# Patient Record
Sex: Female | Born: 1955 | Race: White | Hispanic: No | Marital: Married
Health system: Southern US, Community
[De-identification: ages and names within clinical notes are randomized; demographics above are authoritative.]

## PROBLEM LIST (undated history)

## (undated) DIAGNOSIS — I5032 Chronic diastolic (congestive) heart failure: Secondary | ICD-10-CM

## (undated) DIAGNOSIS — J9621 Acute and chronic respiratory failure with hypoxia: Secondary | ICD-10-CM

## (undated) DIAGNOSIS — J189 Pneumonia, unspecified organism: Secondary | ICD-10-CM

## (undated) DIAGNOSIS — K565 Intestinal adhesions [bands], unspecified as to partial versus complete obstruction: Secondary | ICD-10-CM

## (undated) DIAGNOSIS — Z930 Tracheostomy status: Secondary | ICD-10-CM

---

## 2019-03-04 ENCOUNTER — Inpatient Hospital Stay
Admission: RE | Admit: 2019-03-04 | Discharge: 2019-04-08 | Disposition: A | Discharge status: Skilled Nursing Facility | Payer: Managed Care, Other (non HMO) | Source: Ambulatory Visit | Attending: Internal Medicine | Admitting: Internal Medicine

## 2019-03-04 DIAGNOSIS — J9690 Respiratory failure, unspecified, unspecified whether with hypoxia or hypercapnia: Secondary | ICD-10-CM

## 2019-03-04 DIAGNOSIS — I5032 Chronic diastolic (congestive) heart failure: Secondary | ICD-10-CM | POA: Diagnosis present

## 2019-03-04 DIAGNOSIS — Z931 Gastrostomy status: Secondary | ICD-10-CM

## 2019-03-04 DIAGNOSIS — Z930 Tracheostomy status: Secondary | ICD-10-CM

## 2019-03-04 DIAGNOSIS — J9621 Acute and chronic respiratory failure with hypoxia: Secondary | ICD-10-CM | POA: Diagnosis present

## 2019-03-04 DIAGNOSIS — K565 Intestinal adhesions [bands], unspecified as to partial versus complete obstruction: Secondary | ICD-10-CM | POA: Diagnosis present

## 2019-03-04 DIAGNOSIS — J189 Pneumonia, unspecified organism: Secondary | ICD-10-CM | POA: Diagnosis present

## 2019-03-04 HISTORY — DX: Pneumonia, unspecified organism: J18.9

## 2019-03-04 HISTORY — DX: Intestinal adhesions (bands), unspecified as to partial versus complete obstruction: K56.50

## 2019-03-04 HISTORY — DX: Chronic diastolic (congestive) heart failure: I50.32

## 2019-03-04 HISTORY — DX: Acute and chronic respiratory failure with hypoxia: J96.21

## 2019-03-04 HISTORY — DX: Tracheostomy status: Z93.0

## 2019-03-05 ENCOUNTER — Encounter: Payer: Self-pay | Admitting: Internal Medicine

## 2019-03-05 ENCOUNTER — Other Ambulatory Visit (HOSPITAL_COMMUNITY): Payer: Self-pay

## 2019-03-05 DIAGNOSIS — J189 Pneumonia, unspecified organism: Secondary | ICD-10-CM | POA: Diagnosis not present

## 2019-03-05 DIAGNOSIS — Z930 Tracheostomy status: Secondary | ICD-10-CM

## 2019-03-05 DIAGNOSIS — J9621 Acute and chronic respiratory failure with hypoxia: Secondary | ICD-10-CM

## 2019-03-05 DIAGNOSIS — K565 Intestinal adhesions [bands], unspecified as to partial versus complete obstruction: Secondary | ICD-10-CM | POA: Diagnosis present

## 2019-03-05 DIAGNOSIS — I5032 Chronic diastolic (congestive) heart failure: Secondary | ICD-10-CM | POA: Diagnosis present

## 2019-03-05 LAB — CBC
HCT: 26.3 % — ABNORMAL LOW (ref 36.0–46.0)
Hemoglobin: 8.1 g/dL — ABNORMAL LOW (ref 12.0–15.0)
MCH: 28 pg (ref 26.0–34.0)
MCHC: 30.8 g/dL (ref 30.0–36.0)
MCV: 91 fL (ref 80.0–100.0)
Platelets: 381 10*3/uL (ref 150–400)
RBC: 2.89 MIL/uL — ABNORMAL LOW (ref 3.87–5.11)
RDW: 17 % — ABNORMAL HIGH (ref 11.5–15.5)
WBC: 15.8 10*3/uL — ABNORMAL HIGH (ref 4.0–10.5)
nRBC: 0 % (ref 0.0–0.2)

## 2019-03-05 LAB — BLOOD GAS, ARTERIAL
Acid-Base Excess: 7.4 mmol/L — ABNORMAL HIGH (ref 0.0–2.0)
Bicarbonate: 31 mmol/L — ABNORMAL HIGH (ref 20.0–28.0)
FIO2: 30
O2 Saturation: 97.6 %
Patient temperature: 37
pCO2 arterial: 40.2 mmHg (ref 32.0–48.0)
pH, Arterial: 7.498 — ABNORMAL HIGH (ref 7.350–7.450)
pO2, Arterial: 89 mmHg (ref 83.0–108.0)

## 2019-03-05 LAB — COMPREHENSIVE METABOLIC PANEL
ALT: 63 U/L — ABNORMAL HIGH (ref 0–44)
AST: 49 U/L — ABNORMAL HIGH (ref 15–41)
Albumin: 1.7 g/dL — ABNORMAL LOW (ref 3.5–5.0)
Alkaline Phosphatase: 200 U/L — ABNORMAL HIGH (ref 38–126)
Anion gap: 11 (ref 5–15)
BUN: 18 mg/dL (ref 8–23)
CO2: 27 mmol/L (ref 22–32)
Calcium: 7.9 mg/dL — ABNORMAL LOW (ref 8.9–10.3)
Chloride: 104 mmol/L (ref 98–111)
Creatinine, Ser: 0.57 mg/dL (ref 0.44–1.00)
GFR calc Af Amer: >60 mL/min (ref >60)
GFR calc non Af Amer: >60 mL/min (ref >60)
Glucose, Bld: 101 mg/dL — ABNORMAL HIGH (ref 70–99)
Potassium: 3.6 mmol/L (ref 3.5–5.1)
Sodium: 142 mmol/L (ref 135–145)
Total Bilirubin: 0.8 mg/dL (ref 0.3–1.2)
Total Protein: 5.5 g/dL — ABNORMAL LOW (ref 6.5–8.1)

## 2019-03-05 MED ORDER — SERTRALINE HCL 50 MG PO TABS
100.00 | ORAL_TABLET | ORAL | Status: DC
Start: 2019-03-05 — End: 2019-03-05

## 2019-03-05 MED ORDER — ALBUTEROL SULFATE (2.5 MG/3ML) 0.083% IN NEBU
2.50 | INHALATION_SOLUTION | RESPIRATORY_TRACT | Status: DC
Start: 2019-03-05 — End: 2019-03-05

## 2019-03-05 MED ORDER — ENOXAPARIN SODIUM 40 MG/0.4ML ~~LOC~~ SOLN
40.00 | SUBCUTANEOUS | Status: DC
Start: 2019-03-05 — End: 2019-03-05

## 2019-03-05 MED ORDER — SODIUM CHLORIDE 3 % IN NEBU
3.00 | INHALATION_SOLUTION | RESPIRATORY_TRACT | Status: DC
Start: 2019-03-05 — End: 2019-03-05

## 2019-03-05 MED ORDER — TRAMADOL HCL 50 MG PO TABS
50.00 | ORAL_TABLET | ORAL | Status: DC
Start: ? — End: 2019-03-05

## 2019-03-05 MED ORDER — ACETAMINOPHEN 160 MG/5ML PO SOLN
650.00 | ORAL | Status: DC
Start: 2019-03-05 — End: 2019-03-05

## 2019-03-05 MED ORDER — DSS 100 MG PO CAPS
100.00 | ORAL_CAPSULE | ORAL | Status: DC
Start: 2019-03-05 — End: 2019-03-05

## 2019-03-05 MED ORDER — CHLORHEXIDINE GLUCONATE 0.12 % MT SOLN
15.00 | OROMUCOSAL | Status: DC
Start: ? — End: 2019-03-05

## 2019-03-05 MED ORDER — GENERIC EXTERNAL MEDICATION
12.50 | Status: DC
Start: 2019-03-05 — End: 2019-03-05

## 2019-03-05 MED ORDER — GENERIC EXTERNAL MEDICATION
40.00 | Status: DC
Start: 2019-03-05 — End: 2019-03-05

## 2019-03-05 MED ORDER — BUPROPION HCL 75 MG PO TABS
150.00 | ORAL_TABLET | ORAL | Status: DC
Start: 2019-03-05 — End: 2019-03-05

## 2019-03-05 MED ORDER — FLINTSTONES/EXTRA C PO CHEW
1.00 | CHEWABLE_TABLET | ORAL | Status: DC
Start: 2019-03-05 — End: 2019-03-05

## 2019-03-05 MED ORDER — IOHEXOL 300 MG/ML  SOLN
50.0000 mL | Freq: Once | INTRAMUSCULAR | Status: AC | PRN
  Administered 2019-03-05: 50 mL

## 2019-03-05 MED ORDER — DEXTROSE 10 % IV SOLN
125.00 | INTRAVENOUS | Status: DC
Start: ? — End: 2019-03-05

## 2019-03-05 MED ORDER — ATORVASTATIN CALCIUM 40 MG PO TABS
80.00 | ORAL_TABLET | ORAL | Status: DC
Start: 2019-03-05 — End: 2019-03-05

## 2019-03-05 MED ORDER — GENERIC EXTERNAL MEDICATION
Status: DC
Start: ? — End: 2019-03-05

## 2019-03-05 MED ORDER — POLYETHYLENE GLYCOL 3350 17 GM/SCOOP PO POWD
17.00 | ORAL | Status: DC
Start: 2019-03-05 — End: 2019-03-05

## 2019-03-05 MED ORDER — HYDROMORPHONE HCL 1 MG/ML PO LIQD
1.00 | ORAL | Status: DC
Start: ? — End: 2019-03-05

## 2019-03-05 MED ORDER — CHLORHEXIDINE GLUCONATE 0.12 % MT SOLN
15.00 | OROMUCOSAL | Status: DC
Start: 2019-03-05 — End: 2019-03-05

## 2019-03-05 MED ORDER — ASPIRIN 81 MG PO CHEW
81.00 | CHEWABLE_TABLET | ORAL | Status: DC
Start: 2019-03-05 — End: 2019-03-05

## 2019-03-05 MED ORDER — ASENAPINE MALEATE 10 MG SL SUBL
10.00 | SUBLINGUAL_TABLET | SUBLINGUAL | Status: DC
Start: 2019-03-05 — End: 2019-03-05

## 2019-03-05 MED ORDER — ONDANSETRON HCL 4 MG/2ML IJ SOLN
4.00 | INTRAMUSCULAR | Status: DC
Start: ? — End: 2019-03-05

## 2019-03-05 MED ORDER — GENERIC EXTERNAL MEDICATION
6.25 | Status: DC
Start: ? — End: 2019-03-05

## 2019-03-05 NOTE — Consult Note (Signed)
Pulmonary [place]  Date of Service: 03/05/2019  PULMONARY CRITICAL CARE CONSULT   Kristina Holmes  HER:[id number]  DOB: Aug 24, 1955   DOA: 03/04/2019  Referring Physician: Merton Border, MD  HPI: Kristina Holmes is a 64 y.o. female seen for follow up of Acute on Chronic Respiratory Failure.  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  She had undergone a hysterectomy in January of this year an abdominal CT was done on January 31 which demonstrated a small bowel obstruction.  Patient was having some nausea and vomiting after having been discharged from the acute care facility.  When she came into the ER she was hypotensive with a blood pressure of 85/40 and she was on 4 L of O2.  She was in significant respiratory distress at the time also.  After she had the surgery she had multiple complications including failure to come off of the ventilator development of healthcare associated pneumonia she underwent a tracheostomy on February 19, 2019 subsequently was not able to wean.  The pneumonia she had was secondary to [place] patient was treated with antibiotics.  A follow-up chest x-ray which had been done on the 23rd was reportedly showing improvement of atelectasis.  She has had issues with retention of mucus.  She currently is on assist control mode appears somewhat anxious but otherwise is comfortable.  Review of Systems:  ROS performed and is unremarkable other than noted above.  Past Medical History:  Diagnosis Date  . Abnormal Pap smear of cervix  . Allergy, unspecified not elsewhere classified  . Arthritis  . Astigmatism, irregular  . Calcaneal spur  . Calcaneal  spur  . Cervical high risk HPV (human papillomavirus) test positive 2016, 2017  . Complete gastric outlet obstruction  . Degenerative joint disease involving multiple joints  . Depression  . Deviated nasal septum  . Diabetes mellitus  . GERD (gastroesophageal reflux disease)  . Gestational diabetes  . Granuloma annulare  . Hemorrhoids  . History of depression  . Hyperlipidemia  . Hypertension  . Iron deficiency anemia, unspecified  . Irritable bowel syndrome  . Macular cyst, hole, or pseudohole of retina  . Memory loss  . Morbid obesity ([place])  . Osteopenia after menopause 04/2017  multiple sites  . Other disorder of menstruation and other abnormal bleeding from female genital tract  . Personal history of urinary calculi  . Posterior subcapsular polar senile cataract  . Pseudophakia of both eyes 12/27/2010  . Retinal tear - Both Eyes 12/27/2010  s/p Laser  . Sleep apnea  . Stroke [facility])   Baseline Functional Status with ADLs (bathing, feeding, dressing, toileting and mobility): Independent  Past Surgical History:  Procedure Laterality Date  . ABDOMINAL ADHESION SURGERY  . CARDIAC ELECTROPHYSIOLOGY MAPPING AND ABLATION  . CATARACT EXTRACTION Bilateral  . CESAREAN SECTION  X3  . COLONOSCOPY  . COLPOSCOPY 04/2015  [facility]  . COLPOSCOPY 05/20/2016  [facility]  . COLPOSCOPY 10/21/2018  Impression: NL with atrophy  . KIDNEY STONE SURGERY  . Resection Calcaneal Spur  Right  . ROUX-EN-Y PROCEDURE 2007  . SALPINGOOPHORECTOMY Bilateral 02/03/2019  Procedure: SALPINGO-OOPHORECTOMY OPEN; Surgeon: Lenon Ahmadi, MD; Location: [place]; Service: Gynecology; Laterality: Bilateral;  . SEPTOPLASTY  . SVT Ablation  2000  . TONSILLECTOMY AND ADENOIDECTOMY  . TOTAL ABDOMINAL HYSTERECTOMY N/A 02/03/2019  Procedure: HYSTERECTOMY TOTAL ABDOMINAL; Surgeon: Lenon Ahmadi, MD; Location: [place]; Service: Gynecology; Laterality: N/A;  . TRUNCAL VAGOTOMY  . TUBAL  LIGATION   Family History  Problem Relation Age of Onset  . Cataracts Sister  . Cancer Sister  ovarian  . Ovarian cancer Sister  . Cataracts Sister  . Kidney disease Sister  . Diabetes Mother  . Heart disease Mother  . Hypertension Mother  . Stroke Mother  . Osteoporosis Mother  . Dementia Mother  . Heart disease Father  . Hypertension Father  . Stroke Father  . Emphysema Father  . Nephrolithiasis Father  . Diabetes Maternal Uncle  . Anxiety disorder Daughter  . Anxiety disorder Son  . Cataracts Sister  . Breast cancer Neg Hx  . Thyroid disease Neg Hx  . Colon cancer Neg Hx  . Deep vein thrombosis Neg Hx  . BRCA 1/2 Neg Hx  . Endometrial cancer Neg Hx  . Parkinsonism Neg Hx  . Seizures Neg Hx  . Headache Neg Hx  . Migraines Neg Hx  . Multiple sclerosis Neg Hx     Medications: Reviewed on Rounds  Physical Exam:  Vitals: Temperature is 97.6 pulse 87 respiratory 31 blood pressure is 164/82 saturations 98%  Ventilator Settings mode ventilation assist control FiO2 is 30% tidal volume 389 PEEP 5  . General: Comfortable at this time . Eyes: Grossly normal lids, irises & conjunctiva . ENT: grossly tongue is normal . Neck: no obvious mass . Cardiovascular: S1-S2 normal no gallop or rub . Respiratory: Coarse breath sounds with few scattered rhonchi . Abdomen: Soft and nontender . Skin: no rash seen on limited exam . Musculoskeletal: not rigid . Psychiatric:unable to assess . Neurologic: no seizure no involuntary movements         Labs on Admission:  Basic Metabolic Panel: No results for input(s): NA, K, CL, CO2, GLUCOSE, BUN, CREATININE, CALCIUM, MG, PHOS in the last 168 hours.  Recent Labs  Lab 03/05/19 0415  PHART 7.498*  PCO2ART 40.2  PO2ART 89.0  HCO3 31.0*  O2SAT 97.6    Liver Function Tests: No results for input(s): AST, ALT, ALKPHOS, BILITOT, PROT, ALBUMIN in the last 168 hours. No results for input(s): LIPASE, AMYLASE in the last 168  hours. No results for input(s): AMMONIA in the last 168 hours.  CBC: No results for input(s): WBC, NEUTROABS, HGB, HCT, MCV, PLT in the last 168 hours.  Cardiac Enzymes: No results for input(s): CKTOTAL, CKMB, CKMBINDEX, TROPONINI in the last 168 hours.  BNP (last 3 results) No results for input(s): BNP in the last 8760 hours.  ProBNP (last 3 results) No results for input(s): PROBNP in the last 8760 hours.   Radiological Exams on Admission: DG ABDOMEN PEG TUBE LOCATION  Result Date: 03/05/2019 CLINICAL DATA:  Chest of peg tube placement. EXAM: ABDOMEN - 1 VIEW COMPARISON:  Radiographs dated 02/09/2019 FINDINGS: Contrast was injected into the PEG tube. The tube is demonstrated to be in the body of the stomach. No extravasation of contrast. Moderate gas throughout the nondistended colon. Slight dilatation of a single small bowel loop in the left lower quadrant, diminished since the prior study. IMPRESSION: PEG tube appears to be in the body of the stomach. No extravasation of contrast. Electronically Signed   By: Lorriane Shire M.D.   On: 03/05/2019 08:34    Assessment/Plan Active Problems:   Acute on chronic respiratory failure  with hypoxia ([place])   Healthcare-associated pneumonia   Tracheostomy status ([place])   Chronic diastolic heart failure (HCC)   Small bowel obstruction due to adhesions ([place])   1. Acute on chronic respiratory failure with hypoxia she is obviously had a multiple complicated course with development of respiratory failure post operatively.  She has a history of multiple abdominal surgeries.  The patient had an extensive work-up done including an echocardiogram which showed a preserved ejection fraction however she was felt to be in diastolic heart failure.  At this time we are going to check the RSB I mechanics and try to advance the weaning as tolerated. 2. Healthcare associated pneumonia treated at the other facility completed antibiotic she apparently received  cefepime for 7 days total.  Would recommend getting a follow-up chest x-ray to assess complete resolution. 3. Tracheostomy status ultimately the goal will be to wean and decannulate we will continue with supportive care try pressure support weaning today. 4. Chronic diastolic heart failure preserved ejection fraction last EF was 60 to 65% on the echocardiogram we will continue with supportive care 5. Small bowel obstruction status post exploratory laparotomy with adhesion lysis we will continue with supportive care no sign of obstruction right now  I have personally seen and evaluated the patient, evaluated laboratory and imaging results, formulated the assessment and plan and placed orders. The Patient requires high complexity decision making with multiple systems involvement.  Case was discussed on Rounds with the Respiratory Therapy Director and the Respiratory staff Time Spent 23mnutes  Josip Merolla A Ronesha Heenan, MD FPhysicians Surgery Center Of LebanonPulmonary Critical Care Medicine Sleep Medicine

## 2019-03-06 DIAGNOSIS — J189 Pneumonia, unspecified organism: Secondary | ICD-10-CM | POA: Diagnosis not present

## 2019-03-06 DIAGNOSIS — J9621 Acute and chronic respiratory failure with hypoxia: Secondary | ICD-10-CM | POA: Diagnosis not present

## 2019-03-06 DIAGNOSIS — I5032 Chronic diastolic (congestive) heart failure: Secondary | ICD-10-CM | POA: Diagnosis not present

## 2019-03-06 DIAGNOSIS — K565 Intestinal adhesions [bands], unspecified as to partial versus complete obstruction: Secondary | ICD-10-CM | POA: Diagnosis not present

## 2019-03-06 NOTE — Progress Notes (Addendum)
Pulmonary Critical Care Medicine [facility] [place]   PULMONARY CRITICAL CARE SERVICE  PROGRESS NOTE  Date of Service: 03/06/2019  Kristina Holmes  HWE:[id number]  DOB: April 05, 1955   DOA: 03/04/2019  Referring Physician: Carron Curie, MD  HPI: Kristina Holmes is a 64 y.o. female seen for follow up of Acute on Chronic Respiratory Failure.  Patient continues on pressure support 12/5 FiO2 30% per 8-hour goal today satting well no distress.  Medications: Reviewed on Rounds  Physical Exam:  Vitals: Pulse 75 respirations 24 BP 103/58 O2 sat 98% temp 98.5  Ventilator Settings pressure support 12/5 FiO2 30%  . General: Comfortable at this time . Eyes: Grossly normal lids, irises & conjunctiva . ENT: grossly tongue is normal . Neck: no obvious mass . Cardiovascular: S1 S2 normal no gallop . Respiratory: No rales or rhonchi noted . Abdomen: soft . Skin: no rash seen on limited exam . Musculoskeletal: not rigid . Psychiatric:unable to assess . Neurologic: no seizure no involuntary movements         Lab Data:   Basic Metabolic Panel: Recent Labs  Lab 03/05/19 1104  NA 142  K 3.6  CL 104  CO2 27  GLUCOSE 101*  BUN 18  CREATININE 0.57  CALCIUM 7.9*    ABG: Recent Labs  Lab 03/05/19 0415  PHART 7.498*  PCO2ART 40.2  PO2ART 89.0  HCO3 31.0*  O2SAT 97.6    Liver Function Tests: Recent Labs  Lab 03/05/19 1104  AST 49*  ALT 63*  ALKPHOS 200*  BILITOT 0.8  PROT 5.5*  ALBUMIN 1.7*   No results for input(s): LIPASE, AMYLASE in the last 168 hours. No results for input(s): AMMONIA in the last 168 hours.  CBC: Recent Labs  Lab 03/05/19 1104  WBC 15.8*  HGB 8.1*  HCT 26.3*  MCV 91.0  PLT 381    Cardiac Enzymes: No results for input(s): CKTOTAL, CKMB, CKMBINDEX, TROPONINI in the last 168 hours.  BNP (last 3 results) No results for input(s): BNP in the last 8760 hours.  ProBNP (last 3 results) No results for input(s): PROBNP in the last 8760  hours.  Radiological Exams: DG ABDOMEN PEG TUBE LOCATION  Result Date: 03/05/2019 CLINICAL DATA:  Chest of peg tube placement. EXAM: ABDOMEN - 1 VIEW COMPARISON:  Radiographs dated 02/09/2019 FINDINGS: Contrast was injected into the PEG tube. The tube is demonstrated to be in the body of the stomach. No extravasation of contrast. Moderate gas throughout the nondistended colon. Slight dilatation of a single small bowel loop in the left lower quadrant, diminished since the prior study. IMPRESSION: PEG tube appears to be in the body of the stomach. No extravasation of contrast. Electronically Signed   By: Francene Boyers M.D.   On: 03/05/2019 08:34   DG CHEST PORT 1 VIEW  Result Date: 03/05/2019 CLINICAL DATA:  Respiratory failure. EXAM: PORTABLE CHEST 1 VIEW COMPARISON:  February 09, 2019. FINDINGS: Stable cardiomediastinal silhouette. Tracheostomy tube is in good position. No pneumothorax or pleural effusion is noted. Right apical and left basilar opacities are noted concerning for multifocal pneumonia. Bony thorax is unremarkable. IMPRESSION: Right apical and left basilar opacities are noted concerning for multifocal pneumonia. Electronically Signed   By: Lupita Raider M.D.   On: 03/05/2019 14:48    Assessment/Plan Active Problems:   Acute on chronic respiratory failure with hypoxia (HCC)   Healthcare-associated pneumonia   Tracheostomy status (HCC)   Chronic diastolic heart failure (HCC)   Small bowel obstruction  due to adhesions ([place])   1. Acute on chronic respiratory with hypoxia patient has made our goal today on pressure support 12/5 and FiO2 30% currently satting well we will continue to wean per protocol.  Continue aggressive pulmonary toilet and supportive measures. 2. Healthcare associated pneumonia treated at other facility antibiotic course completed.  Continue to monitor 3. Tracheostomy remains in place we will continue with weaning and discuss decannulation at a future  date. 4. Chronic diastolic heart failure preserved ejection fraction continue supportive care 5. Small bowel obstruction status post exploratory laparoscopy with adhesion lysis continue with supportive care   I have personally seen and evaluated the patient, evaluated laboratory and imaging results, formulated the assessment and plan and placed orders. The Patient requires high complexity decision making with multiple systems involvement.  Rounds were done with the Respiratory Therapy Director and Staff therapists and discussed with nursing staff also.  Allyne Gee, MD [facility] Pulmonary Critical Care Medicine Sleep Medicine

## 2019-03-07 DIAGNOSIS — J9621 Acute and chronic respiratory failure with hypoxia: Secondary | ICD-10-CM | POA: Diagnosis not present

## 2019-03-07 DIAGNOSIS — I5032 Chronic diastolic (congestive) heart failure: Secondary | ICD-10-CM | POA: Diagnosis not present

## 2019-03-07 DIAGNOSIS — J189 Pneumonia, unspecified organism: Secondary | ICD-10-CM | POA: Diagnosis not present

## 2019-03-07 DIAGNOSIS — K565 Intestinal adhesions [bands], unspecified as to partial versus complete obstruction: Secondary | ICD-10-CM | POA: Diagnosis not present

## 2019-03-07 LAB — CULTURE, RESPIRATORY W GRAM STAIN: Culture: NORMAL

## 2019-03-07 NOTE — Progress Notes (Signed)
Pulmonary Critical Care Medicine [facility] [place]   PULMONARY CRITICAL CARE SERVICE  PROGRESS NOTE  Date of Service: 03/07/2019  Kristina Holmes  AJO:[id number]  DOB: [birth year]   DOA: 03/04/2019  Referring Physician: Carron Curie, MD  HPI: Kristina Holmes is a 64 y.o. female seen for follow up of Acute on Chronic Respiratory Failure.  Patient at this time is weaning on pressure support is actually doing well and tolerating pressure support 12/5 good tidal volumes are noted at this time.  Medications: Reviewed on Rounds  Physical Exam:  Vitals: Temperature is 98.8 pulse 83 respiratory 24 blood pressure is 174/74 saturations 99%  Ventilator Settings on pressure support FiO2 30% pressure 12/5 tidal volume 485  . General: Comfortable at this time . Eyes: Grossly normal lids, irises & conjunctiva . ENT: grossly tongue is normal . Neck: no obvious mass . Cardiovascular: S1 S2 normal no gallop . Respiratory: No rhonchi no rales are noted at this time . Abdomen: soft . Skin: no rash seen on limited exam . Musculoskeletal: not rigid . Psychiatric:unable to assess . Neurologic: no seizure no involuntary movements         Lab Data:   Basic Metabolic Panel: Recent Labs  Lab 03/05/19 1104  NA 142  K 3.6  CL 104  CO2 27  GLUCOSE 101*  BUN 18  CREATININE 0.57  CALCIUM 7.9*    ABG: Recent Labs  Lab 03/05/19 0415  PHART 7.498*  PCO2ART 40.2  PO2ART 89.0  HCO3 31.0*  O2SAT 97.6    Liver Function Tests: Recent Labs  Lab 03/05/19 1104  AST 49*  ALT 63*  ALKPHOS 200*  BILITOT 0.8  PROT 5.5*  ALBUMIN 1.7*   No results for input(s): LIPASE, AMYLASE in the last 168 hours. No results for input(s): AMMONIA in the last 168 hours.  CBC: Recent Labs  Lab 03/05/19 1104  WBC 15.8*  HGB 8.1*  HCT 26.3*  MCV 91.0  PLT 381    Cardiac Enzymes: No results for input(s): CKTOTAL, CKMB, CKMBINDEX, TROPONINI in the last 168 hours.  BNP (last 3  results) No results for input(s): BNP in the last 8760 hours.  ProBNP (last 3 results) No results for input(s): PROBNP in the last 8760 hours.  Radiological Exams: DG CHEST PORT 1 VIEW  Result Date: 03/05/2019 CLINICAL DATA:  Respiratory failure. EXAM: PORTABLE CHEST 1 VIEW COMPARISON:  February 09, 2019. FINDINGS: Stable cardiomediastinal silhouette. Tracheostomy tube is in good position. No pneumothorax or pleural effusion is noted. Right apical and left basilar opacities are noted concerning for multifocal pneumonia. Bony thorax is unremarkable. IMPRESSION: Right apical and left basilar opacities are noted concerning for multifocal pneumonia. Electronically Signed   By: Lupita Raider M.D.   On: 03/05/2019 14:48    Assessment/Plan Active Problems:   Acute on chronic respiratory failure with hypoxia (HCC)   Healthcare-associated pneumonia   Tracheostomy status (HCC)   Chronic diastolic heart failure (HCC)   Small bowel obstruction due to adhesions (HCC)   1. Acute on chronic respiratory failure with hypoxia continue with pressure support weaning as tolerated titrate oxygen continue pulmonary toilet 2. Healthcare associated pneumonia treated we will continue to follow 3. Chronic diastolic heart failure compensated 4. Small bowel obstruction status post exploratory laparotomy 5. Tracheostomy remains in place   I have personally seen and evaluated the patient, evaluated laboratory and imaging results, formulated the assessment and plan and placed orders. The Patient requires high complexity decision making with  multiple systems involvement.  Rounds were done with the Respiratory Therapy Director and Staff therapists and discussed with nursing staff also.  Allyne Gee, MD [facility] Pulmonary Critical Care Medicine Sleep Medicine

## 2019-03-08 DIAGNOSIS — K565 Intestinal adhesions [bands], unspecified as to partial versus complete obstruction: Secondary | ICD-10-CM | POA: Diagnosis not present

## 2019-03-08 DIAGNOSIS — J189 Pneumonia, unspecified organism: Secondary | ICD-10-CM | POA: Diagnosis not present

## 2019-03-08 DIAGNOSIS — J9621 Acute and chronic respiratory failure with hypoxia: Secondary | ICD-10-CM | POA: Diagnosis not present

## 2019-03-08 DIAGNOSIS — I5032 Chronic diastolic (congestive) heart failure: Secondary | ICD-10-CM | POA: Diagnosis not present

## 2019-03-08 NOTE — Progress Notes (Signed)
Pulmonary Critical Care Medicine [facility] [place]   PULMONARY CRITICAL CARE SERVICE  PROGRESS NOTE  Date of Service: 03/08/2019  Kristina Holmes  BHA:[id number]  DOB: November 01, 1955   DOA: 03/04/2019  Referring Physician: Carron Curie, MD  HPI: Kristina Holmes is a 64 y.o. female seen for follow up of Acute on Chronic Respiratory Failure.  Patient at this time is weaning on pressure support with a goal of 16 hours doing well  Medications: Reviewed on Rounds  Physical Exam:  Vitals: Temperature is 98.5 pulse 79 respiratory rate 22 blood pressure is 146/75 saturations 99%  Ventilator Settings on pressure support FiO2 30% pressure 12 PEEP 5  . General: Comfortable at this time . Eyes: Grossly normal lids, irises & conjunctiva . ENT: grossly tongue is normal . Neck: no obvious mass . Cardiovascular: S1 S2 normal no gallop . Respiratory: No rhonchi no rales noted at this time . Abdomen: soft . Skin: no rash seen on limited exam . Musculoskeletal: not rigid . Psychiatric:unable to assess . Neurologic: no seizure no involuntary movements         Lab Data:   Basic Metabolic Panel: Recent Labs  Lab 03/05/19 1104  NA 142  K 3.6  CL 104  CO2 27  GLUCOSE 101*  BUN 18  CREATININE 0.57  CALCIUM 7.9*    ABG: Recent Labs  Lab 03/05/19 0415  PHART 7.498*  PCO2ART 40.2  PO2ART 89.0  HCO3 31.0*  O2SAT 97.6    Liver Function Tests: Recent Labs  Lab 03/05/19 1104  AST 49*  ALT 63*  ALKPHOS 200*  BILITOT 0.8  PROT 5.5*  ALBUMIN 1.7*   No results for input(s): LIPASE, AMYLASE in the last 168 hours. No results for input(s): AMMONIA in the last 168 hours.  CBC: Recent Labs  Lab 03/05/19 1104  WBC 15.8*  HGB 8.1*  HCT 26.3*  MCV 91.0  PLT 381    Cardiac Enzymes: No results for input(s): CKTOTAL, CKMB, CKMBINDEX, TROPONINI in the last 168 hours.  BNP (last 3 results) No results for input(s): BNP in the last 8760 hours.  ProBNP (last 3  results) No results for input(s): PROBNP in the last 8760 hours.  Radiological Exams: No results found.  Assessment/Plan Active Problems:   Acute on chronic respiratory failure with hypoxia (HCC)   Healthcare-associated pneumonia   Tracheostomy status (HCC)   Chronic diastolic heart failure (HCC)   Small bowel obstruction due to adhesions (HCC)   1. Acute on chronic respiratory failure with hypoxia continue to wean on pressure support mode patient is tolerating it well we will continue to advance today's goal is 16 hours. 2. Healthcare associated pneumonia treated clinically is improving we will continue to follow along 3. Tracheostomy remains in place on weaning protocol 4. Chronic diastolic heart failure compensated 5. Small bowel obstruction status post laparotomy   I have personally seen and evaluated the patient, evaluated laboratory and imaging results, formulated the assessment and plan and placed orders. The Patient requires high complexity decision making with multiple systems involvement.  Rounds were done with the Respiratory Therapy Director and Staff therapists and discussed with nursing staff also.  Yevonne Pax, MD [facility] Pulmonary Critical Care Medicine Sleep Medicine

## 2019-03-09 DIAGNOSIS — K565 Intestinal adhesions [bands], unspecified as to partial versus complete obstruction: Secondary | ICD-10-CM | POA: Diagnosis not present

## 2019-03-09 DIAGNOSIS — I5032 Chronic diastolic (congestive) heart failure: Secondary | ICD-10-CM | POA: Diagnosis not present

## 2019-03-09 DIAGNOSIS — J9621 Acute and chronic respiratory failure with hypoxia: Secondary | ICD-10-CM | POA: Diagnosis not present

## 2019-03-09 DIAGNOSIS — J189 Pneumonia, unspecified organism: Secondary | ICD-10-CM | POA: Diagnosis not present

## 2019-03-09 NOTE — Progress Notes (Signed)
Pulmonary Critical Care Medicine [facility] [place]   PULMONARY CRITICAL CARE SERVICE  PROGRESS NOTE  Date of Service: 03/09/2019  Kristina Holmes  TLX:[id number]  DOB: 1955/05/03   DOA: 03/04/2019  Referring Physician: Carron Curie, MD  HPI: Kristina Holmes is a 64 y.o. female seen for follow up of Acute on Chronic Respiratory Failure.  Patient is right now on pressure support mode has been on 30% FiO2 has excellent tidal volumes on 12/5 scheduled to do T collar trial today  Medications: Reviewed on Rounds  Physical Exam:  Vitals: Temperature is 98.4 pulse 79 respiratory 24 blood pressure is 154/83 saturations 99%  Ventilator Settings pressure support FiO2 30% tidal volume 750 pressure poor 12 PEEP 5  . General: Comfortable at this time . Eyes: Grossly normal lids, irises & conjunctiva . ENT: grossly tongue is normal . Neck: no obvious mass . Cardiovascular: S1 S2 normal no gallop . Respiratory: No rhonchi coarse breath sounds are noted . Abdomen: soft . Skin: no rash seen on limited exam . Musculoskeletal: not rigid . Psychiatric:unable to assess . Neurologic: no seizure no involuntary movements         Lab Data:   Basic Metabolic Panel: Recent Labs  Lab 03/05/19 1104  NA 142  K 3.6  CL 104  CO2 27  GLUCOSE 101*  BUN 18  CREATININE 0.57  CALCIUM 7.9*    ABG: Recent Labs  Lab 03/05/19 0415  PHART 7.498*  PCO2ART 40.2  PO2ART 89.0  HCO3 31.0*  O2SAT 97.6    Liver Function Tests: Recent Labs  Lab 03/05/19 1104  AST 49*  ALT 63*  ALKPHOS 200*  BILITOT 0.8  PROT 5.5*  ALBUMIN 1.7*   No results for input(s): LIPASE, AMYLASE in the last 168 hours. No results for input(s): AMMONIA in the last 168 hours.  CBC: Recent Labs  Lab 03/05/19 1104  WBC 15.8*  HGB 8.1*  HCT 26.3*  MCV 91.0  PLT 381    Cardiac Enzymes: No results for input(s): CKTOTAL, CKMB, CKMBINDEX, TROPONINI in the last 168 hours.  BNP (last 3 results) No  results for input(s): BNP in the last 8760 hours.  ProBNP (last 3 results) No results for input(s): PROBNP in the last 8760 hours.  Radiological Exams: No results found.  Assessment/Plan Active Problems:   Acute on chronic respiratory failure with hypoxia (HCC)   Healthcare-associated pneumonia   Tracheostomy status (HCC)   Chronic diastolic heart failure (HCC)   Small bowel obstruction due to adhesions (HCC)   1. Acute on chronic respiratory failure with hypoxia plan is to continue with the wean protocol on T collar today 2. Healthcare associated pneumonia treated clinically improving 3. Tracheostomy remains in place 4. Chronic diastolic heart failure compensated 5. Small bowel obstruction status post laparotomy continue supportive care   I have personally seen and evaluated the patient, evaluated laboratory and imaging results, formulated the assessment and plan and placed orders. The Patient requires high complexity decision making with multiple systems involvement.  Rounds were done with the Respiratory Therapy Director and Staff therapists and discussed with nursing staff also.  Yevonne Pax, MD [facility] Pulmonary Critical Care Medicine Sleep Medicine

## 2019-03-10 ENCOUNTER — Other Ambulatory Visit (HOSPITAL_COMMUNITY): Payer: Self-pay

## 2019-03-10 DIAGNOSIS — K565 Intestinal adhesions [bands], unspecified as to partial versus complete obstruction: Secondary | ICD-10-CM | POA: Diagnosis not present

## 2019-03-10 DIAGNOSIS — I5032 Chronic diastolic (congestive) heart failure: Secondary | ICD-10-CM | POA: Diagnosis not present

## 2019-03-10 DIAGNOSIS — J189 Pneumonia, unspecified organism: Secondary | ICD-10-CM | POA: Diagnosis not present

## 2019-03-10 DIAGNOSIS — J9621 Acute and chronic respiratory failure with hypoxia: Secondary | ICD-10-CM | POA: Diagnosis not present

## 2019-03-10 LAB — BASIC METABOLIC PANEL
Anion gap: 11 (ref 5–15)
BUN: 20 mg/dL (ref 8–23)
CO2: 29 mmol/L (ref 22–32)
Calcium: 8.7 mg/dL — ABNORMAL LOW (ref 8.9–10.3)
Chloride: 96 mmol/L — ABNORMAL LOW (ref 98–111)
Creatinine, Ser: 0.63 mg/dL (ref 0.44–1.00)
GFR calc Af Amer: >60 mL/min (ref >60)
GFR calc non Af Amer: >60 mL/min (ref >60)
Glucose, Bld: 127 mg/dL — ABNORMAL HIGH (ref 70–99)
Potassium: 4.4 mmol/L (ref 3.5–5.1)
Sodium: 136 mmol/L (ref 135–145)

## 2019-03-10 LAB — CBC
HCT: 26.8 % — ABNORMAL LOW (ref 36.0–46.0)
Hemoglobin: 8.1 g/dL — ABNORMAL LOW (ref 12.0–15.0)
MCH: 27.6 pg (ref 26.0–34.0)
MCHC: 30.2 g/dL (ref 30.0–36.0)
MCV: 91.5 fL (ref 80.0–100.0)
Platelets: 321 10*3/uL (ref 150–400)
RBC: 2.93 MIL/uL — ABNORMAL LOW (ref 3.87–5.11)
RDW: 16.7 % — ABNORMAL HIGH (ref 11.5–15.5)
WBC: 13 10*3/uL — ABNORMAL HIGH (ref 4.0–10.5)
nRBC: 0 % (ref 0.0–0.2)

## 2019-03-10 NOTE — Progress Notes (Signed)
Pulmonary Critical Care Medicine [facility] [place]   PULMONARY CRITICAL CARE SERVICE  PROGRESS NOTE  Date of Service: 03/10/2019  Kristina Holmes  UKG:[id number]  DOB: 08/23/1955   DOA: 03/04/2019  Referring Physician: Carron Curie, MD  HPI: Kristina Holmes is a 64 y.o. female seen for follow up of Acute on Chronic Respiratory Failure.  Patient currently is on T collar has been on 30% FiO2 with a goal of 8 hours  Medications: Reviewed on Rounds  Physical Exam:  Vitals: Temperature is 97.8 pulse 82 respiratory rate 39 blood pressure is 154/77 saturations are 99%  Ventilator Settings on T collar with an FiO2 of 30%  . General: Comfortable at this time . Eyes: Grossly normal lids, irises & conjunctiva . ENT: grossly tongue is normal . Neck: no obvious mass . Cardiovascular: S1 S2 normal no gallop . Respiratory: No rhonchi coarse breath sounds are noted . Abdomen: soft . Skin: no rash seen on limited exam . Musculoskeletal: not rigid . Psychiatric:unable to assess . Neurologic: no seizure no involuntary movements         Lab Data:   Basic Metabolic Panel: Recent Labs  Lab 03/05/19 1104 03/10/19 0913  NA 142 136  K 3.6 4.4  CL 104 96*  CO2 27 29  GLUCOSE 101* 127*  BUN 18 20  CREATININE 0.57 0.63  CALCIUM 7.9* 8.7*    ABG: Recent Labs  Lab 03/05/19 0415  PHART 7.498*  PCO2ART 40.2  PO2ART 89.0  HCO3 31.0*  O2SAT 97.6    Liver Function Tests: Recent Labs  Lab 03/05/19 1104  AST 49*  ALT 63*  ALKPHOS 200*  BILITOT 0.8  PROT 5.5*  ALBUMIN 1.7*   No results for input(s): LIPASE, AMYLASE in the last 168 hours. No results for input(s): AMMONIA in the last 168 hours.  CBC: Recent Labs  Lab 03/05/19 1104 03/10/19 0913  WBC 15.8* 13.0*  HGB 8.1* 8.1*  HCT 26.3* 26.8*  MCV 91.0 91.5  PLT 381 321    Cardiac Enzymes: No results for input(s): CKTOTAL, CKMB, CKMBINDEX, TROPONINI in the last 168 hours.  BNP (last 3 results) No  results for input(s): BNP in the last 8760 hours.  ProBNP (last 3 results) No results for input(s): PROBNP in the last 8760 hours.  Radiological Exams: DG CHEST PORT 1 VIEW  Result Date: 03/10/2019 CLINICAL DATA:  Respiratory failure. Tracheostomy tube. EXAM: PORTABLE CHEST 1 VIEW COMPARISON:  One-view chest x-ray 03/05/2019 FINDINGS: Heart size is normal. Lung volumes remain low. Tracheostomy tube is in stable and satisfactory position. Interstitial and airspace disease has improved, still worse on the left. No other focal airspace disease is present. IMPRESSION: 1. Improving interstitial and airspace disease, still worse on the left. 2. Low lung volumes. Electronically Signed   By: Marin Roberts M.D.   On: 03/10/2019 06:18    Assessment/Plan Active Problems:   Acute on chronic respiratory failure with hypoxia (HCC)   Healthcare-associated pneumonia   Tracheostomy status (HCC)   Chronic diastolic heart failure (HCC)   Small bowel obstruction due to adhesions (HCC)   1. Acute on chronic respiratory failure hypoxia plan is to continue with T collar trials as tolerated. 2. Healthcare associated pneumonia baseline we will continue to follow along 3. Chronic diastolic heart failure no change continue to monitor 4. Tracheostomy remains in place 5. Small bowel obstruction status post laparotomy   I have personally seen and evaluated the patient, evaluated laboratory and imaging results, formulated the  assessment and plan and placed orders. The Patient requires high complexity decision making with multiple systems involvement.  Rounds were done with the Respiratory Therapy Director and Staff therapists and discussed with nursing staff also.  Allyne Gee, MD [facility] Pulmonary Critical Care Medicine Sleep Medicine

## 2019-03-11 DIAGNOSIS — J9621 Acute and chronic respiratory failure with hypoxia: Secondary | ICD-10-CM | POA: Diagnosis not present

## 2019-03-11 DIAGNOSIS — I5032 Chronic diastolic (congestive) heart failure: Secondary | ICD-10-CM | POA: Diagnosis not present

## 2019-03-11 DIAGNOSIS — J189 Pneumonia, unspecified organism: Secondary | ICD-10-CM | POA: Diagnosis not present

## 2019-03-11 DIAGNOSIS — K565 Intestinal adhesions [bands], unspecified as to partial versus complete obstruction: Secondary | ICD-10-CM | POA: Diagnosis not present

## 2019-03-11 NOTE — Progress Notes (Signed)
Pulmonary Critical Care Medicine [place]   PULMONARY CRITICAL CARE SERVICE  PROGRESS NOTE  Date of Service: 03/11/2019  Kristina Holmes  [id number]:[id number]  DOB: December 29, 1955   DOA: 03/04/2019  Referring Physician: Merton Border, MD  HPI: Kristina Holmes is a 64 y.o. female seen for follow up of Acute on Chronic Respiratory Failure.  At this time patient is comfortably without any distress right now is on pressure support mode on 28% FiO2 12/5  Medications: Reviewed on Rounds  Physical Exam:  Vitals: Temperature 97.8 pulse 79 respiratory rate 25 blood pressure is 153/75 saturations 98%  Ventilator Settings mode ventilation pressure support FiO2 28% pressure poor 12 PEEP 5  . General: Comfortable at this time . Eyes: Grossly normal lids, irises & conjunctiva . ENT: grossly tongue is normal . Neck: no obvious mass . Cardiovascular: S1 S2 normal no gallop . Respiratory: No rhonchi no rales are noted at this time . Abdomen: soft . Skin: no rash seen on limited exam . Musculoskeletal: not rigid . Psychiatric:unable to assess . Neurologic: no seizure no involuntary movements         Lab Data:   Basic Metabolic Panel: Recent Labs  Lab 03/05/19 1104 03/10/19 0913  NA 142 136  K 3.6 4.4  CL 104 96*  CO2 27 29  GLUCOSE 101* 127*  BUN 18 20  CREATININE 0.57 0.63  CALCIUM 7.9* 8.7*    ABG: Recent Labs  Lab 03/05/19 0415  PHART 7.498*  PCO2ART 40.2  PO2ART 89.0  HCO3 31.0*  O2SAT 97.6    Liver Function Tests: Recent Labs  Lab 03/05/19 1104  AST 49*  ALT 63*  ALKPHOS 200*  BILITOT 0.8  PROT 5.5*  ALBUMIN 1.7*   No results for input(s): LIPASE, AMYLASE in the last 168 hours. No results for input(s): AMMONIA in the last 168 hours.  CBC: Recent Labs  Lab 03/05/19 1104 03/10/19 0913  WBC 15.8* 13.0*  HGB 8.1* 8.1*  HCT 26.3* 26.8*  MCV 91.0 91.5  PLT 381 321    Cardiac Enzymes: No results for input(s): CKTOTAL, CKMB, CKMBINDEX,  TROPONINI in the last 168 hours.  BNP (last 3 results) No results for input(s): BNP in the last 8760 hours.  ProBNP (last 3 results) No results for input(s): PROBNP in the last 8760 hours.  Radiological Exams: DG CHEST PORT 1 VIEW  Result Date: 03/10/2019 CLINICAL DATA:  Respiratory failure. Tracheostomy tube. EXAM: PORTABLE CHEST 1 VIEW COMPARISON:  One-view chest x-ray 03/05/2019 FINDINGS: Heart size is normal. Lung volumes remain low. Tracheostomy tube is in stable and satisfactory position. Interstitial and airspace disease has improved, still worse on the left. No other focal airspace disease is present. IMPRESSION: 1. Improving interstitial and airspace disease, still worse on the left. 2. Low lung volumes. Electronically Signed   By: San Morelle M.D.   On: 03/10/2019 06:18    Assessment/Plan Active Problems:   Acute on chronic respiratory failure with hypoxia (HCC)   Healthcare-associated pneumonia   Tracheostomy status (HCC)   Chronic diastolic heart failure (HCC)   Small bowel obstruction due to adhesions ([place])   1. Acute on chronic respiratory failure with hypoxia plan is to continue with pressure support titrate oxygen continue pulmonary toilet.  Goal is going to be for 12 hours. 2. Healthcare associated pneumonia treated we will continue present management 3. Tracheostomy working towards weaning 4. Chronic diastolic heart failure no change 5. Small bowel obstruction status post laparotomy at baseline we  will continue to follow along   I have personally seen and evaluated the patient, evaluated laboratory and imaging results, formulated the assessment and plan and placed orders. The Patient requires high complexity decision making with multiple systems involvement.  Rounds were done with the Respiratory Therapy Director and Staff therapists and discussed with nursing staff also.  Yevonne Pax, MD [facility] Pulmonary Critical Care Medicine Sleep Medicine

## 2019-03-12 DIAGNOSIS — J9621 Acute and chronic respiratory failure with hypoxia: Secondary | ICD-10-CM | POA: Diagnosis not present

## 2019-03-12 DIAGNOSIS — J189 Pneumonia, unspecified organism: Secondary | ICD-10-CM | POA: Diagnosis not present

## 2019-03-12 DIAGNOSIS — K565 Intestinal adhesions [bands], unspecified as to partial versus complete obstruction: Secondary | ICD-10-CM | POA: Diagnosis not present

## 2019-03-12 DIAGNOSIS — I5032 Chronic diastolic (congestive) heart failure: Secondary | ICD-10-CM | POA: Diagnosis not present

## 2019-03-12 NOTE — Progress Notes (Addendum)
Pulmonary Critical Care Medicine [facility] [place]   PULMONARY CRITICAL CARE SERVICE  PROGRESS NOTE  Date of Service: 03/12/2019  [name]  LZJ:[id number]  DOB: Feb 13, 1955   DOA: 03/04/2019  Referring Physician: Carron Curie, MD  HPI: Kristina Holmes is a 64 y.o. female seen for follow up of Acute on Chronic Respiratory Failure.  Patient continues aerosol trach collar 28% today for 16-hour goal is BP 1  Medications: Reviewed on Rounds  Physical Exam:  Vitals: Pulse 77 respirations 23 BP 130/78 O2 sat 96% temp 97.9  Ventilator Settings ATC 28%  . General: Comfortable at this time . Eyes: Grossly normal lids, irises & conjunctiva . ENT: grossly tongue is normal . Neck: no obvious mass . Cardiovascular: S1 S2 normal no gallop . Respiratory: No rales or rhonchi noted . Abdomen: soft . Skin: no rash seen on limited exam . Musculoskeletal: not rigid . Psychiatric:unable to assess . Neurologic: no seizure no involuntary movements         Lab Data:   Basic Metabolic Panel: Recent Labs  Lab 03/10/19 0913  NA 136  K 4.4  CL 96*  CO2 29  GLUCOSE 127*  BUN 20  CREATININE 0.63  CALCIUM 8.7*    ABG: No results for input(s): PHART, PCO2ART, PO2ART, HCO3, O2SAT in the last 168 hours.  Liver Function Tests: No results for input(s): AST, ALT, ALKPHOS, BILITOT, PROT, ALBUMIN in the last 168 hours. No results for input(s): LIPASE, AMYLASE in the last 168 hours. No results for input(s): AMMONIA in the last 168 hours.  CBC: Recent Labs  Lab 03/10/19 0913  WBC 13.0*  HGB 8.1*  HCT 26.8*  MCV 91.5  PLT 321    Cardiac Enzymes: No results for input(s): CKTOTAL, CKMB, CKMBINDEX, TROPONINI in the last 168 hours.  BNP (last 3 results) No results for input(s): BNP in the last 8760 hours.  ProBNP (last 3 results) No results for input(s): PROBNP in the last 8760 hours.  Radiological Exams: No results found.  Assessment/Plan Active Problems:    Acute on chronic respiratory failure with hypoxia (HCC)   Healthcare-associated pneumonia   Tracheostomy status (HCC)   Chronic diastolic heart failure (HCC)   Small bowel obstruction due to adhesions (HCC)   1. Acute on chronic respiratory failure with hypoxia plan is to continue with pressure support titrate oxygen continue pulmonary toilet.  Goal is going to be for 16 hours. 2. Healthcare associated pneumonia treated we will continue present management 3. Tracheostomy working towards weaning 4. Chronic diastolic heart failure no change 5. Small bowel obstruction status post laparotomy at baseline we will continue to follow along   I have personally seen and evaluated the patient, evaluated laboratory and imaging results, formulated the assessment and plan and placed orders. The Patient requires high complexity decision making with multiple systems involvement.  Rounds were done with the Respiratory Therapy Director and Staff therapists and discussed with nursing staff also.  Yevonne Pax, MD [facility] Pulmonary Critical Care Medicine Sleep Medicine

## 2019-03-13 DIAGNOSIS — I5032 Chronic diastolic (congestive) heart failure: Secondary | ICD-10-CM | POA: Diagnosis not present

## 2019-03-13 DIAGNOSIS — J189 Pneumonia, unspecified organism: Secondary | ICD-10-CM | POA: Diagnosis not present

## 2019-03-13 DIAGNOSIS — K565 Intestinal adhesions [bands], unspecified as to partial versus complete obstruction: Secondary | ICD-10-CM | POA: Diagnosis not present

## 2019-03-13 DIAGNOSIS — J9621 Acute and chronic respiratory failure with hypoxia: Secondary | ICD-10-CM | POA: Diagnosis not present

## 2019-03-13 NOTE — Progress Notes (Addendum)
Pulmonary Critical Care Medicine [facility] [place]   PULMONARY CRITICAL CARE SERVICE  PROGRESS NOTE  Date of Service: 03/13/2019  CARRA BRINDLEY  WUJ:[id number]  DOB: Mar 01, 1955   DOA: 03/04/2019  Referring Physician: Carron Curie, MD  HPI: Kristina Holmes is a 64 y.o. female seen for follow up of Acute on Chronic Respiratory Failure.  Patient remains on aerosol trach collar 20% FiO2 for 20-hour goal today.  Medications: Reviewed on Rounds  Physical Exam:  Vitals: Pulse 77 respirations 24 BP 141/70 O2 sat 100% temp 97.5  Ventilator Settings ATC 28%  . General: Comfortable at this time . Eyes: Grossly normal lids, irises & conjunctiva . ENT: grossly tongue is normal . Neck: no obvious mass . Cardiovascular: S1 S2 normal no gallop . Respiratory: No rales or rhonchi noted . Abdomen: soft . Skin: no rash seen on limited exam . Musculoskeletal: not rigid . Psychiatric:unable to assess . Neurologic: no seizure no involuntary movements         Lab Data:   Basic Metabolic Panel: Recent Labs  Lab 03/10/19 0913  NA 136  K 4.4  CL 96*  CO2 29  GLUCOSE 127*  BUN 20  CREATININE 0.63  CALCIUM 8.7*    ABG: No results for input(s): PHART, PCO2ART, PO2ART, HCO3, O2SAT in the last 168 hours.  Liver Function Tests: No results for input(s): AST, ALT, ALKPHOS, BILITOT, PROT, ALBUMIN in the last 168 hours. No results for input(s): LIPASE, AMYLASE in the last 168 hours. No results for input(s): AMMONIA in the last 168 hours.  CBC: Recent Labs  Lab 03/10/19 0913  WBC 13.0*  HGB 8.1*  HCT 26.8*  MCV 91.5  PLT 321    Cardiac Enzymes: No results for input(s): CKTOTAL, CKMB, CKMBINDEX, TROPONINI in the last 168 hours.  BNP (last 3 results) No results for input(s): BNP in the last 8760 hours.  ProBNP (last 3 results) No results for input(s): PROBNP in the last 8760 hours.  Radiological Exams: No results found.  Assessment/Plan Active Problems:    Acute on chronic respiratory failure with hypoxia (HCC)   Healthcare-associated pneumonia   Tracheostomy status (HCC)   Chronic diastolic heart failure (HCC)   Small bowel obstruction due to adhesions (HCC)   1. Acute on chronic respiratory failure with hypoxia plan is to continue with pressure support titrate oxygen continue pulmonary toilet. Goal is going to be for 20 hours 2. Healthcare associated pneumonia treated we will continue present management 3. Tracheostomy working towards weaning 4. Chronic diastolic heart failure no change 5. Small bowel obstruction status post laparotomy at baseline we will continue to follow along   I have personally seen and evaluated the patient, evaluated laboratory and imaging results, formulated the assessment and plan and placed orders. The Patient requires high complexity decision making with multiple systems involvement.  Rounds were done with the Respiratory Therapy Director and Staff therapists and discussed with nursing staff also.  Yevonne Pax, MD [facility] Pulmonary Critical Care Medicine Sleep Medicine

## 2019-03-14 DIAGNOSIS — K565 Intestinal adhesions [bands], unspecified as to partial versus complete obstruction: Secondary | ICD-10-CM | POA: Diagnosis not present

## 2019-03-14 DIAGNOSIS — J9621 Acute and chronic respiratory failure with hypoxia: Secondary | ICD-10-CM | POA: Diagnosis not present

## 2019-03-14 DIAGNOSIS — I5032 Chronic diastolic (congestive) heart failure: Secondary | ICD-10-CM | POA: Diagnosis not present

## 2019-03-14 DIAGNOSIS — J189 Pneumonia, unspecified organism: Secondary | ICD-10-CM | POA: Diagnosis not present

## 2019-03-14 NOTE — Progress Notes (Addendum)
Pulmonary Critical Care Medicine [facility] [place]   PULMONARY CRITICAL CARE SERVICE  PROGRESS NOTE  Date of Service: 03/14/2019  Kristina Holmes  [id number]:[id number]  DOB: [birth year]   DOA: 03/04/2019  Referring Physician: Carron Curie, MD  HPI: Kristina Holmes is a 64 y.o. female seen for follow up of Acute on Chronic Respiratory Failure.  Patient is on aerosol trach collar 28% at this time for 24 goal.  Medications: Reviewed on Rounds  Physical Exam:  Vitals: Pulse 80 respirations 25 BP 144/71 O2 sat 100% temp 98.5  Ventilator Settings 20% ATC  . General: Comfortable at this time . Eyes: Grossly normal lids, irises & conjunctiva . ENT: grossly tongue is normal . Neck: no obvious mass . Cardiovascular: S1 S2 normal no gallop . Respiratory: No rales or rhonchi noted . Abdomen: soft . Skin: no rash seen on limited exam . Musculoskeletal: not rigid . Psychiatric:unable to assess . Neurologic: no seizure no involuntary movements         Lab Data:   Basic Metabolic Panel: Recent Labs  Lab 03/10/19 0913  NA 136  K 4.4  CL 96*  CO2 29  GLUCOSE 127*  BUN 20  CREATININE 0.63  CALCIUM 8.7*    ABG: No results for input(s): PHART, PCO2ART, PO2ART, HCO3, O2SAT in the last 168 hours.  Liver Function Tests: No results for input(s): AST, ALT, ALKPHOS, BILITOT, PROT, ALBUMIN in the last 168 hours. No results for input(s): LIPASE, AMYLASE in the last 168 hours. No results for input(s): AMMONIA in the last 168 hours.  CBC: Recent Labs  Lab 03/10/19 0913  WBC 13.0*  HGB 8.1*  HCT 26.8*  MCV 91.5  PLT 321    Cardiac Enzymes: No results for input(s): CKTOTAL, CKMB, CKMBINDEX, TROPONINI in the last 168 hours.  BNP (last 3 results) No results for input(s): BNP in the last 8760 hours.  ProBNP (last 3 results) No results for input(s): PROBNP in the last 8760 hours.  Radiological Exams: No results found.  Assessment/Plan Active Problems:   Acute on  chronic respiratory failure with hypoxia (HCC)   Healthcare-associated pneumonia   Tracheostomy status (HCC)   Chronic diastolic heart failure (HCC)   Small bowel obstruction due to adhesions (HCC)   1. Acute on chronic respiratory failure with hypoxia plan is to continue with pressure support titrate oxygen continue pulmonary toilet. Goal is going to be for 24 hours 2. Healthcare associated pneumonia treated we will continue present management 3. Tracheostomy working towards weaning 4. Chronic diastolic heart failure no change 5. Small bowel obstruction status post laparotomy at baseline we will continue to follow along   I have personally seen and evaluated the patient, evaluated laboratory and imaging results, formulated the assessment and plan and placed orders. The Patient requires high complexity decision making with multiple systems involvement.  Rounds were done with the Respiratory Therapy Director and Staff therapists and discussed with nursing staff also.  Yevonne Pax, MD [facility] Pulmonary Critical Care Medicine Sleep Medicine

## 2019-03-15 DIAGNOSIS — J9621 Acute and chronic respiratory failure with hypoxia: Secondary | ICD-10-CM | POA: Diagnosis not present

## 2019-03-15 DIAGNOSIS — I5032 Chronic diastolic (congestive) heart failure: Secondary | ICD-10-CM | POA: Diagnosis not present

## 2019-03-15 DIAGNOSIS — J189 Pneumonia, unspecified organism: Secondary | ICD-10-CM | POA: Diagnosis not present

## 2019-03-15 DIAGNOSIS — K565 Intestinal adhesions [bands], unspecified as to partial versus complete obstruction: Secondary | ICD-10-CM | POA: Diagnosis not present

## 2019-03-15 NOTE — Progress Notes (Signed)
Pulmonary Critical Care Medicine [facility] [place]   PULMONARY CRITICAL CARE SERVICE  PROGRESS NOTE  Date of Service: 03/15/2019  Kristina Holmes  [id number]:[id number]  DOB: July 16, 1955   DOA: 03/04/2019  Referring Physician: Carron Curie, MD  HPI: Kristina Holmes is a 64 y.o. female seen for follow up of Acute on Chronic Respiratory Failure.  Patient right now is on T collar with a goal of about 48 hours today  Medications: Reviewed on Rounds  Physical Exam:  Vitals: Temperature is 97.7 pulse 87 respiratory 26 blood pressure is 153/82 saturations 100%  Ventilator Settings currently is on T collar off the ventilator FiO2 of 28%  . General: Comfortable at this time . Eyes: Grossly normal lids, irises & conjunctiva . ENT: grossly tongue is normal . Neck: no obvious mass . Cardiovascular: S1 S2 normal no gallop . Respiratory: No rhonchi coarse breath sounds . Abdomen: soft . Skin: no rash seen on limited exam . Musculoskeletal: not rigid . Psychiatric:unable to assess . Neurologic: no seizure no involuntary movements         Lab Data:   Basic Metabolic Panel: Recent Labs  Lab 03/10/19 0913  NA 136  K 4.4  CL 96*  CO2 29  GLUCOSE 127*  BUN 20  CREATININE 0.63  CALCIUM 8.7*    ABG: No results for input(s): PHART, PCO2ART, PO2ART, HCO3, O2SAT in the last 168 hours.  Liver Function Tests: No results for input(s): AST, ALT, ALKPHOS, BILITOT, PROT, ALBUMIN in the last 168 hours. No results for input(s): LIPASE, AMYLASE in the last 168 hours. No results for input(s): AMMONIA in the last 168 hours.  CBC: Recent Labs  Lab 03/10/19 0913  WBC 13.0*  HGB 8.1*  HCT 26.8*  MCV 91.5  PLT 321    Cardiac Enzymes: No results for input(s): CKTOTAL, CKMB, CKMBINDEX, TROPONINI in the last 168 hours.  BNP (last 3 results) No results for input(s): BNP in the last 8760 hours.  ProBNP (last 3 results) No results for input(s): PROBNP in the last 8760  hours.  Radiological Exams: No results found.  Assessment/Plan Active Problems:   Acute on chronic respiratory failure with hypoxia (HCC)   Healthcare-associated pneumonia   Tracheostomy status (HCC)   Chronic diastolic heart failure (HCC)   Small bowel obstruction due to adhesions (HCC)   1. Acute on chronic respiratory failure hypoxia continue to wean goal today is for 48 hours off the ventilator. 2. Healthcare associated pneumonia treated clinically is improving 3. Chronic diastolic heart failure compensated 4. Small bowel obstruction status post laparotomy 5. Tracheostomy working towards capping soon   I have personally seen and evaluated the patient, evaluated laboratory and imaging results, formulated the assessment and plan and placed orders. The Patient requires high complexity decision making with multiple systems involvement.  Rounds were done with the Respiratory Therapy Director and Staff therapists and discussed with nursing staff also.  Yevonne Pax, MD [facility] Pulmonary Critical Care Medicine Sleep Medicine

## 2019-03-16 DIAGNOSIS — J189 Pneumonia, unspecified organism: Secondary | ICD-10-CM | POA: Diagnosis not present

## 2019-03-16 DIAGNOSIS — I5032 Chronic diastolic (congestive) heart failure: Secondary | ICD-10-CM | POA: Diagnosis not present

## 2019-03-16 DIAGNOSIS — K565 Intestinal adhesions [bands], unspecified as to partial versus complete obstruction: Secondary | ICD-10-CM | POA: Diagnosis not present

## 2019-03-16 DIAGNOSIS — J9621 Acute and chronic respiratory failure with hypoxia: Secondary | ICD-10-CM | POA: Diagnosis not present

## 2019-03-16 NOTE — Progress Notes (Signed)
Pulmonary Critical Care Medicine [facility] [place]   PULMONARY CRITICAL CARE SERVICE  PROGRESS NOTE  Date of Service: 03/16/2019  Kristina Holmes  SWN:[id number]  DOB: Feb 07, 1955   DOA: 03/04/2019  Referring Physician: Carron Curie, MD  HPI: Kristina Holmes is a 64 y.o. female seen for follow up of Acute on Chronic Respiratory Failure.  Patient is weaning on protocol however had a mucous plug at night and had to be placed back on the ventilator.  Right now he is on pressure support on 12/5 20% FiO2.  Medications: Reviewed on Rounds  Physical Exam:  Vitals: Temperature is 98.6 pulse 86 respiratory rate 20 blood pressure is 139/74 saturations 100%  Ventilator Settings on pressure support with an FiO2 of 28% pressure poor 12/5  . General: Comfortable at this time . Eyes: Grossly normal lids, irises & conjunctiva . ENT: grossly tongue is normal . Neck: no obvious mass . Cardiovascular: S1 S2 normal no gallop . Respiratory: No rhonchi coarse breath sounds are noted . Abdomen: soft . Skin: no rash seen on limited exam . Musculoskeletal: not rigid . Psychiatric:unable to assess . Neurologic: no seizure no involuntary movements         Lab Data:   Basic Metabolic Panel: Recent Labs  Lab 03/10/19 0913  NA 136  K 4.4  CL 96*  CO2 29  GLUCOSE 127*  BUN 20  CREATININE 0.63  CALCIUM 8.7*    ABG: No results for input(s): PHART, PCO2ART, PO2ART, HCO3, O2SAT in the last 168 hours.  Liver Function Tests: No results for input(s): AST, ALT, ALKPHOS, BILITOT, PROT, ALBUMIN in the last 168 hours. No results for input(s): LIPASE, AMYLASE in the last 168 hours. No results for input(s): AMMONIA in the last 168 hours.  CBC: Recent Labs  Lab 03/10/19 0913  WBC 13.0*  HGB 8.1*  HCT 26.8*  MCV 91.5  PLT 321    Cardiac Enzymes: No results for input(s): CKTOTAL, CKMB, CKMBINDEX, TROPONINI in the last 168 hours.  BNP (last 3 results) No results for input(s): BNP  in the last 8760 hours.  ProBNP (last 3 results) No results for input(s): PROBNP in the last 8760 hours.  Radiological Exams: No results found.  Assessment/Plan Active Problems:   Acute on chronic respiratory failure with hypoxia (HCC)   Healthcare-associated pneumonia   Tracheostomy status (HCC)   Chronic diastolic heart failure (HCC)   Small bowel obstruction due to adhesions (HCC)   1. Acute on chronic respiratory failure with hypoxia continue with reassessment and try to start weaning again on pressure support.  Titrate oxygen continue pulmonary toilet. 2. Chronic diastolic heart failure We will continue to monitor. 3. Healthcare associated pneumonia treated we will follow 4. Small bowel obstruction no changes are noted at this time 5. Tracheostomy remains in place   I have personally seen and evaluated the patient, evaluated laboratory and imaging results, formulated the assessment and plan and placed orders. The Patient requires high complexity decision making with multiple systems involvement.  Rounds were done with the Respiratory Therapy Director and Staff therapists and discussed with nursing staff also.  Yevonne Pax, MD [facility] Pulmonary Critical Care Medicine Sleep Medicine

## 2019-03-17 DIAGNOSIS — K565 Intestinal adhesions [bands], unspecified as to partial versus complete obstruction: Secondary | ICD-10-CM | POA: Diagnosis not present

## 2019-03-17 DIAGNOSIS — J189 Pneumonia, unspecified organism: Secondary | ICD-10-CM | POA: Diagnosis not present

## 2019-03-17 DIAGNOSIS — J9621 Acute and chronic respiratory failure with hypoxia: Secondary | ICD-10-CM | POA: Diagnosis not present

## 2019-03-17 DIAGNOSIS — I5032 Chronic diastolic (congestive) heart failure: Secondary | ICD-10-CM | POA: Diagnosis not present

## 2019-03-17 LAB — CBC
HCT: 29.7 % — ABNORMAL LOW (ref 36.0–46.0)
Hemoglobin: 9.2 g/dL — ABNORMAL LOW (ref 12.0–15.0)
MCH: 27.6 pg (ref 26.0–34.0)
MCHC: 31 g/dL (ref 30.0–36.0)
MCV: 89.2 fL (ref 80.0–100.0)
Platelets: 214 10*3/uL (ref 150–400)
RBC: 3.33 MIL/uL — ABNORMAL LOW (ref 3.87–5.11)
RDW: 17.1 % — ABNORMAL HIGH (ref 11.5–15.5)
WBC: 7.6 10*3/uL (ref 4.0–10.5)
nRBC: 0 % (ref 0.0–0.2)

## 2019-03-17 LAB — BASIC METABOLIC PANEL
Anion gap: 10 (ref 5–15)
BUN: 31 mg/dL — ABNORMAL HIGH (ref 8–23)
CO2: 27 mmol/L (ref 22–32)
Calcium: 8.9 mg/dL (ref 8.9–10.3)
Chloride: 100 mmol/L (ref 98–111)
Creatinine, Ser: 0.76 mg/dL (ref 0.44–1.00)
GFR calc Af Amer: >60 mL/min (ref >60)
GFR calc non Af Amer: >60 mL/min (ref >60)
Glucose, Bld: 128 mg/dL — ABNORMAL HIGH (ref 70–99)
Potassium: 4.1 mmol/L (ref 3.5–5.1)
Sodium: 137 mmol/L (ref 135–145)

## 2019-03-17 NOTE — Progress Notes (Addendum)
Pulmonary Critical Care Medicine [facility] [place]   PULMONARY CRITICAL CARE SERVICE  PROGRESS NOTE  Date of Service: 03/17/2019  Kristina Holmes  [id number]  DOB: [birth year]   DOA: 03/04/2019  Referring Physician: Carron Curie, MD  HPI: Kristina Holmes is a 64 y.o. female seen for follow up of Acute on Chronic Respiratory Failure.  Patient has a goal today 48 hours on aerosol trach collar 20% FiO2.  Currently satting well no fever distress.  Medications: Reviewed on Rounds  Physical Exam:  Vitals: Pulse 78 respirations 30 BP 124/85 O2 sat 100% temp 98.4  Ventilator Settings ATC 28%  . General: Comfortable at this time . Eyes: Grossly normal lids, irises & conjunctiva . ENT: grossly tongue is normal . Neck: no obvious mass . Cardiovascular: S1 S2 normal no gallop . Respiratory: No rales or rhonchi noted . Abdomen: soft . Skin: no rash seen on limited exam . Musculoskeletal: not rigid . Psychiatric:unable to assess . Neurologic: no seizure no involuntary movements         Lab Data:   Basic Metabolic Panel: Recent Labs  Lab 03/17/19 0523  NA 137  K 4.1  CL 100  CO2 27  GLUCOSE 128*  BUN 31*  CREATININE 0.76  CALCIUM 8.9    ABG: No results for input(s): PHART, PCO2ART, PO2ART, HCO3, O2SAT in the last 168 hours.  Liver Function Tests: No results for input(s): AST, ALT, ALKPHOS, BILITOT, PROT, ALBUMIN in the last 168 hours. No results for input(s): LIPASE, AMYLASE in the last 168 hours. No results for input(s): AMMONIA in the last 168 hours.  CBC: Recent Labs  Lab 03/17/19 0523  WBC 7.6  HGB 9.2*  HCT 29.7*  MCV 89.2  PLT 214    Cardiac Enzymes: No results for input(s): CKTOTAL, CKMB, CKMBINDEX, TROPONINI in the last 168 hours.  BNP (last 3 results) No results for input(s): BNP in the last 8760 hours.  ProBNP (last 3 results) No results for input(s): PROBNP in the last 8760 hours.  Radiological Exams: No results  found.  Assessment/Plan Active Problems:   Acute on chronic respiratory failure with hypoxia (HCC)   Healthcare-associated pneumonia   Tracheostomy status (HCC)   Chronic diastolic heart failure (HCC)   Small bowel obstruction due to adhesions (HCC)   1. Acute on chronic respiratory failure with hypoxia continue with trach collar trial at this time.  Goals 48 hours.  Continue aggressive pulmonary toilet and supportive measures. 2. Chronic diastolic heart failure We will continue to monitor. 3. Healthcare associated pneumonia treated we will follow 4. Small bowel obstruction no changes are noted at this time 5. Tracheostomy remains in place   I have personally seen and evaluated the patient, evaluated laboratory and imaging results, formulated the assessment and plan and placed orders. The Patient requires high complexity decision making with multiple systems involvement.  Rounds were done with the Respiratory Therapy Director and Staff therapists and discussed with nursing staff also.  Yevonne Pax, MD [facility] Pulmonary Critical Care Medicine Sleep Medicine

## 2019-03-18 DIAGNOSIS — J189 Pneumonia, unspecified organism: Secondary | ICD-10-CM | POA: Diagnosis not present

## 2019-03-18 DIAGNOSIS — I5032 Chronic diastolic (congestive) heart failure: Secondary | ICD-10-CM | POA: Diagnosis not present

## 2019-03-18 DIAGNOSIS — K565 Intestinal adhesions [bands], unspecified as to partial versus complete obstruction: Secondary | ICD-10-CM | POA: Diagnosis not present

## 2019-03-18 DIAGNOSIS — J9621 Acute and chronic respiratory failure with hypoxia: Secondary | ICD-10-CM | POA: Diagnosis not present

## 2019-03-18 NOTE — Progress Notes (Addendum)
Pulmonary Critical Care Medicine [facility] [place]   PULMONARY CRITICAL CARE SERVICE  PROGRESS NOTE  Date of Service: 03/18/2019  Kristina Holmes  [id number]:[id number]  DOB: 11-Feb-1955   DOA: 03/04/2019  Referring Physician: Carron Curie, MD  HPI: Kristina Holmes is a 64 y.o. female seen for follow up of Acute on Chronic Respiratory Failure.  Patient is doing well in the morning.  Patient has been on 28% FiO2 today will be 48 hours on the T collar.  Medications: Reviewed on Rounds  Physical Exam:  Vitals: Temperature is 98.7 pulse 67 respiratory rate 20 blood pressure is 131/67 saturations 99%  Ventilator Settings off the ventilator on T collar 28% with a goal of 48 hours  . General: Comfortable at this time . Eyes: Grossly normal lids, irises & conjunctiva . ENT: grossly tongue is normal . Neck: no obvious mass . Cardiovascular: S1 S2 normal no gallop . Respiratory: No rhonchi no rales are noted at this time . Abdomen: soft . Skin: no rash seen on limited exam . Musculoskeletal: not rigid . Psychiatric:unable to assess . Neurologic: no seizure no involuntary movements         Lab Data:   Basic Metabolic Panel: Recent Labs  Lab 03/17/19 0523  NA 137  K 4.1  CL 100  CO2 27  GLUCOSE 128*  BUN 31*  CREATININE 0.76  CALCIUM 8.9    ABG: No results for input(s): PHART, PCO2ART, PO2ART, HCO3, O2SAT in the last 168 hours.  Liver Function Tests: No results for input(s): AST, ALT, ALKPHOS, BILITOT, PROT, ALBUMIN in the last 168 hours. No results for input(s): LIPASE, AMYLASE in the last 168 hours. No results for input(s): AMMONIA in the last 168 hours.  CBC: Recent Labs  Lab 03/17/19 0523  WBC 7.6  HGB 9.2*  HCT 29.7*  MCV 89.2  PLT 214    Cardiac Enzymes: No results for input(s): CKTOTAL, CKMB, CKMBINDEX, TROPONINI in the last 168 hours.  BNP (last 3 results) No results for input(s): BNP in the last 8760 hours.  ProBNP (last 3 results) No  results for input(s): PROBNP in the last 8760 hours.  Radiological Exams: No results found.  Assessment/Plan Active Problems:   Acute on chronic respiratory failure with hypoxia (HCC)   Healthcare-associated pneumonia   Tracheostomy status (HCC)   Chronic diastolic heart failure (HCC)   Small bowel obstruction due to adhesions (HCC)   1. Acute on chronic respiratory failure with hypoxia patient currently is on T collar has been on 28% FiO2 today will be completing 48 hours. 2. Healthcare associated pneumonia resolved 3. Tracheostomy remains in place has occasional plug noted by respiratory therapy we will need to keep an eye on this closely. 4. Chronic diastolic heart failure is compensated 5. Small bowel obstruction status post laparotomy we will continue to monitor   I have personally seen and evaluated the patient, evaluated laboratory and imaging results, formulated the assessment and plan and placed orders. The Patient requires high complexity decision making with multiple systems involvement.  Rounds were done with the Respiratory Therapy Director and Staff therapists and discussed with nursing staff also.  Yevonne Pax, MD [facility] Pulmonary Critical Care Medicine Sleep Medicine

## 2019-03-19 DIAGNOSIS — K565 Intestinal adhesions [bands], unspecified as to partial versus complete obstruction: Secondary | ICD-10-CM | POA: Diagnosis not present

## 2019-03-19 DIAGNOSIS — J189 Pneumonia, unspecified organism: Secondary | ICD-10-CM | POA: Diagnosis not present

## 2019-03-19 DIAGNOSIS — I5032 Chronic diastolic (congestive) heart failure: Secondary | ICD-10-CM | POA: Diagnosis not present

## 2019-03-19 DIAGNOSIS — J9621 Acute and chronic respiratory failure with hypoxia: Secondary | ICD-10-CM | POA: Diagnosis not present

## 2019-03-19 NOTE — Progress Notes (Addendum)
Pulmonary Critical Care Medicine [facility] [place]   PULMONARY CRITICAL CARE SERVICE  PROGRESS NOTE  Date of Service: 03/19/2019  [name]  [id number]:[id number]  DOB: Oct 12, 1955   DOA: 03/04/2019  Referring Physician: Carron Curie, MD  HPI: Kristina Holmes is a 64 y.o. female seen for follow up of Acute on Chronic Respiratory Failure.  Patient is a 72-hour goal at this time on 2 L nasal cannula satting well this time with no distress.  Medications: Reviewed on Rounds  Physical Exam:  Vitals: Pulse 92 versus 20 BP 159/77 O2 sat 100% temp 98.9  Ventilator Settings 2 L nasal cannula  . General: Comfortable at this time . Eyes: Grossly normal lids, irises & conjunctiva . ENT: grossly tongue is normal . Neck: no obvious mass . Cardiovascular: S1 S2 normal no gallop . Respiratory: No rales or rhonchi noted . Abdomen: soft . Skin: no rash seen on limited exam . Musculoskeletal: not rigid . Psychiatric:unable to assess . Neurologic: no seizure no involuntary movements         Lab Data:   Basic Metabolic Panel: Recent Labs  Lab 03/17/19 0523  NA 137  K 4.1  CL 100  CO2 27  GLUCOSE 128*  BUN 31*  CREATININE 0.76  CALCIUM 8.9    ABG: No results for input(s): PHART, PCO2ART, PO2ART, HCO3, O2SAT in the last 168 hours.  Liver Function Tests: No results for input(s): AST, ALT, ALKPHOS, BILITOT, PROT, ALBUMIN in the last 168 hours. No results for input(s): LIPASE, AMYLASE in the last 168 hours. No results for input(s): AMMONIA in the last 168 hours.  CBC: Recent Labs  Lab 03/17/19 0523  WBC 7.6  HGB 9.2*  HCT 29.7*  MCV 89.2  PLT 214    Cardiac Enzymes: No results for input(s): CKTOTAL, CKMB, CKMBINDEX, TROPONINI in the last 168 hours.  BNP (last 3 results) No results for input(s): BNP in the last 8760 hours.  ProBNP (last 3 results) No results for input(s): PROBNP in the last 8760 hours.  Radiological Exams: No results  found.  Assessment/Plan Active Problems:   Acute on chronic respiratory failure with hypoxia (HCC)   Healthcare-associated pneumonia   Tracheostomy status (HCC)   Chronic diastolic heart failure (HCC)   Small bowel obstruction due to adhesions (HCC)   1. Acute on chronic respiratory failure with hypoxia patient has a 72-hour goal at this time on 2 L nasal cannula has been off the vent for more than 48 hours.  Satting well we will continue supportive measures and pulmonary toilet. 2. Healthcare associated pneumonia resolved 3. Tracheostomy remains in place has occasional plug noted by respiratory therapy we will need to keep an eye on this closely. 4. Chronic diastolic heart failure is compensated 5. Small bowel obstruction status post laparotomy we will continue to monitor   I have personally seen and evaluated the patient, evaluated laboratory and imaging results, formulated the assessment and plan and placed orders. The Patient requires high complexity decision making with multiple systems involvement.  Rounds were done with the Respiratory Therapy Director and Staff therapists and discussed with nursing staff also.  Yevonne Pax, MD [facility] Pulmonary Critical Care Medicine Sleep Medicine

## 2019-03-20 DIAGNOSIS — J9621 Acute and chronic respiratory failure with hypoxia: Secondary | ICD-10-CM | POA: Diagnosis not present

## 2019-03-20 DIAGNOSIS — I5032 Chronic diastolic (congestive) heart failure: Secondary | ICD-10-CM | POA: Diagnosis not present

## 2019-03-20 DIAGNOSIS — K565 Intestinal adhesions [bands], unspecified as to partial versus complete obstruction: Secondary | ICD-10-CM | POA: Diagnosis not present

## 2019-03-20 DIAGNOSIS — J189 Pneumonia, unspecified organism: Secondary | ICD-10-CM | POA: Diagnosis not present

## 2019-03-20 NOTE — Progress Notes (Signed)
Pulmonary Critical Care Medicine [facility] [place]   PULMONARY CRITICAL CARE SERVICE  PROGRESS NOTE  Date of Service: 03/20/2019  Kristina Holmes  [id number]  DOB: 1955-01-24   DOA: 03/04/2019  Referring Physician: Carron Curie, MD  HPI: Kristina Holmes is a 64 y.o. female seen for follow up of Acute on Chronic Respiratory Failure.  Patient is on T collar is doing fairly well collar should be able to start with capping trials today  Medications: Reviewed on Rounds  Physical Exam:  Vitals: Temperature is 97.5 pulse 78 respiratory rate 37 blood pressure is 142/59 saturations 95%  Ventilator Settings on T collar with an FiO2 of 28%  . General: Comfortable at this time . Eyes: Grossly normal lids, irises & conjunctiva . ENT: grossly tongue is normal . Neck: no obvious mass . Cardiovascular: S1 S2 normal no gallop . Respiratory: No rhonchi coarse breath sounds . Abdomen: soft . Skin: no rash seen on limited exam . Musculoskeletal: not rigid . Psychiatric:unable to assess . Neurologic: no seizure no involuntary movements         Lab Data:   Basic Metabolic Panel: Recent Labs  Lab 03/17/19 0523  NA 137  K 4.1  CL 100  CO2 27  GLUCOSE 128*  BUN 31*  CREATININE 0.76  CALCIUM 8.9    ABG: No results for input(s): PHART, PCO2ART, PO2ART, HCO3, O2SAT in the last 168 hours.  Liver Function Tests: No results for input(s): AST, ALT, ALKPHOS, BILITOT, PROT, ALBUMIN in the last 168 hours. No results for input(s): LIPASE, AMYLASE in the last 168 hours. No results for input(s): AMMONIA in the last 168 hours.  CBC: Recent Labs  Lab 03/17/19 0523  WBC 7.6  HGB 9.2*  HCT 29.7*  MCV 89.2  PLT 214    Cardiac Enzymes: No results for input(s): CKTOTAL, CKMB, CKMBINDEX, TROPONINI in the last 168 hours.  BNP (last 3 results) No results for input(s): BNP in the last 8760 hours.  ProBNP (last 3 results) No results for input(s): PROBNP in the last 8760  hours.  Radiological Exams: No results found.  Assessment/Plan Active Problems:   Acute on chronic respiratory failure with hypoxia (HCC)   Healthcare-associated pneumonia   Tracheostomy status (HCC)   Chronic diastolic heart failure (HCC)   Small bowel obstruction due to adhesions (HCC)   1. Acute on chronic respiratory failure with hypoxia advance the weaning to capping trials today.  Titrate oxygen as necessary we will continue with secretions with supportive care. 2. Chronic diastolic heart failure compensated we will continue with present management 3. Healthcare associated pneumonia treated resolved 4. Tracheostomy will begin with capping 5. Small bowel obstruction status post resection laparotomy   I have personally seen and evaluated the patient, evaluated laboratory and imaging results, formulated the assessment and plan and placed orders. The Patient requires high complexity decision making with multiple systems involvement.  Rounds were done with the Respiratory Therapy Director and Staff therapists and discussed with nursing staff also.  Yevonne Pax, MD [facility] Pulmonary Critical Care Medicine Sleep Medicine

## 2019-03-21 DIAGNOSIS — J189 Pneumonia, unspecified organism: Secondary | ICD-10-CM | POA: Diagnosis not present

## 2019-03-21 DIAGNOSIS — J9621 Acute and chronic respiratory failure with hypoxia: Secondary | ICD-10-CM | POA: Diagnosis not present

## 2019-03-21 DIAGNOSIS — I5032 Chronic diastolic (congestive) heart failure: Secondary | ICD-10-CM | POA: Diagnosis not present

## 2019-03-21 DIAGNOSIS — K565 Intestinal adhesions [bands], unspecified as to partial versus complete obstruction: Secondary | ICD-10-CM | POA: Diagnosis not present

## 2019-03-21 LAB — CBC
HCT: 29.5 % — ABNORMAL LOW (ref 36.0–46.0)
Hemoglobin: 9.1 g/dL — ABNORMAL LOW (ref 12.0–15.0)
MCH: 27.5 pg (ref 26.0–34.0)
MCHC: 30.8 g/dL (ref 30.0–36.0)
MCV: 89.1 fL (ref 80.0–100.0)
Platelets: 228 10*3/uL (ref 150–400)
RBC: 3.31 MIL/uL — ABNORMAL LOW (ref 3.87–5.11)
RDW: 17.2 % — ABNORMAL HIGH (ref 11.5–15.5)
WBC: 7.5 10*3/uL (ref 4.0–10.5)
nRBC: 0 % (ref 0.0–0.2)

## 2019-03-21 LAB — BASIC METABOLIC PANEL
Anion gap: 9 (ref 5–15)
BUN: 53 mg/dL — ABNORMAL HIGH (ref 8–23)
CO2: 27 mmol/L (ref 22–32)
Calcium: 9.3 mg/dL (ref 8.9–10.3)
Chloride: 99 mmol/L (ref 98–111)
Creatinine, Ser: 0.74 mg/dL (ref 0.44–1.00)
GFR calc Af Amer: >60 mL/min (ref >60)
GFR calc non Af Amer: >60 mL/min (ref >60)
Glucose, Bld: 129 mg/dL — ABNORMAL HIGH (ref 70–99)
Potassium: 4.5 mmol/L (ref 3.5–5.1)
Sodium: 135 mmol/L (ref 135–145)

## 2019-03-21 NOTE — Progress Notes (Signed)
Pulmonary Critical Care Medicine [facility] [place]   PULMONARY CRITICAL CARE SERVICE  PROGRESS NOTE  Date of Service: 03/21/2019  TOBY AYAD  [id number]:[id number]  DOB: [birth year]   DOA: 03/04/2019  Referring Physician: Carron Curie, MD  HPI: Kristina Holmes is a 64 y.o. female seen for follow up of Acute on Chronic Respiratory Failure.  Patient is capping right now on nasal cannula doing fairly well  Medications: Reviewed on Rounds  Physical Exam:  Vitals: Temperature is 98.7 pulse 80 respiratory 26 blood pressure is 145/55 saturations 97%  Ventilator Settings capping on nasal cannula  . General: Comfortable at this time . Eyes: Grossly normal lids, irises & conjunctiva . ENT: grossly tongue is normal . Neck: no obvious mass . Cardiovascular: S1 S2 normal no gallop . Respiratory: No rhonchi coarse breath sounds . Abdomen: soft . Skin: no rash seen on limited exam . Musculoskeletal: not rigid . Psychiatric:unable to assess . Neurologic: no seizure no involuntary movements         Lab Data:   Basic Metabolic Panel: Recent Labs  Lab 03/17/19 0523 03/21/19 0538  NA 137 135  K 4.1 4.5  CL 100 99  CO2 27 27  GLUCOSE 128* 129*  BUN 31* 53*  CREATININE 0.76 0.74  CALCIUM 8.9 9.3    ABG: No results for input(s): PHART, PCO2ART, PO2ART, HCO3, O2SAT in the last 168 hours.  Liver Function Tests: No results for input(s): AST, ALT, ALKPHOS, BILITOT, PROT, ALBUMIN in the last 168 hours. No results for input(s): LIPASE, AMYLASE in the last 168 hours. No results for input(s): AMMONIA in the last 168 hours.  CBC: Recent Labs  Lab 03/17/19 0523 03/21/19 0538  WBC 7.6 7.5  HGB 9.2* 9.1*  HCT 29.7* 29.5*  MCV 89.2 89.1  PLT 214 228    Cardiac Enzymes: No results for input(s): CKTOTAL, CKMB, CKMBINDEX, TROPONINI in the last 168 hours.  BNP (last 3 results) No results for input(s): BNP in the last 8760 hours.  ProBNP (last 3 results) No results for  input(s): PROBNP in the last 8760 hours.  Radiological Exams: No results found.  Assessment/Plan Active Problems:   Acute on chronic respiratory failure with hypoxia (HCC)   Healthcare-associated pneumonia   Tracheostomy status (HCC)   Chronic diastolic heart failure (HCC)   Small bowel obstruction due to adhesions (HCC)   1. Acute on chronic respiratory failure hypoxia continue with capping trials as ordered. 2. Healthcare associated pneumonia treated clinically improving 3. Chronic diastolic heart failure compensated 4. Small bowel obstruction status post resection   I have personally seen and evaluated the patient, evaluated laboratory and imaging results, formulated the assessment and plan and placed orders. The Patient requires high complexity decision making with multiple systems involvement.  Rounds were done with the Respiratory Therapy Director and Staff therapists and discussed with nursing staff also.  Yevonne Pax, MD [facility] Pulmonary Critical Care Medicine Sleep Medicine

## 2019-03-22 DIAGNOSIS — I5032 Chronic diastolic (congestive) heart failure: Secondary | ICD-10-CM | POA: Diagnosis not present

## 2019-03-22 DIAGNOSIS — J9621 Acute and chronic respiratory failure with hypoxia: Secondary | ICD-10-CM | POA: Diagnosis not present

## 2019-03-22 DIAGNOSIS — J189 Pneumonia, unspecified organism: Secondary | ICD-10-CM | POA: Diagnosis not present

## 2019-03-22 DIAGNOSIS — K565 Intestinal adhesions [bands], unspecified as to partial versus complete obstruction: Secondary | ICD-10-CM | POA: Diagnosis not present

## 2019-03-22 NOTE — Progress Notes (Signed)
Pulmonary Critical Care Medicine [facility] [place]   PULMONARY CRITICAL CARE SERVICE  PROGRESS NOTE  Date of Service: 03/22/2019  Kristina Holmes  [id number]  DOB: 1955-10-17   DOA: 03/04/2019  Referring Physician: Carron Curie, MD  HPI: Kristina Holmes is a 64 y.o. female seen for follow up of Acute on Chronic Respiratory Failure.  Patient right now is capping has been on room air today will be 48 hours  Medications: Reviewed on Rounds  Physical Exam:  Vitals: Temperature is 97.6 pulse 89 respiratory 26 blood pressure is 125/64 saturations 99%  Ventilator Settings capping on room air  . General: Comfortable at this time . Eyes: Grossly normal lids, irises & conjunctiva . ENT: grossly tongue is normal . Neck: no obvious mass . Cardiovascular: S1 S2 normal no gallop . Respiratory: No rhonchi no rales noted . Abdomen: soft . Skin: no rash seen on limited exam . Musculoskeletal: not rigid . Psychiatric:unable to assess . Neurologic: no seizure no involuntary movements         Lab Data:   Basic Metabolic Panel: Recent Labs  Lab 03/17/19 0523 03/21/19 0538  NA 137 135  K 4.1 4.5  CL 100 99  CO2 27 27  GLUCOSE 128* 129*  BUN 31* 53*  CREATININE 0.76 0.74  CALCIUM 8.9 9.3    ABG: No results for input(s): PHART, PCO2ART, PO2ART, HCO3, O2SAT in the last 168 hours.  Liver Function Tests: No results for input(s): AST, ALT, ALKPHOS, BILITOT, PROT, ALBUMIN in the last 168 hours. No results for input(s): LIPASE, AMYLASE in the last 168 hours. No results for input(s): AMMONIA in the last 168 hours.  CBC: Recent Labs  Lab 03/17/19 0523 03/21/19 0538  WBC 7.6 7.5  HGB 9.2* 9.1*  HCT 29.7* 29.5*  MCV 89.2 89.1  PLT 214 228    Cardiac Enzymes: No results for input(s): CKTOTAL, CKMB, CKMBINDEX, TROPONINI in the last 168 hours.  BNP (last 3 results) No results for input(s): BNP in the last 8760 hours.  ProBNP (last 3 results) No results for  input(s): PROBNP in the last 8760 hours.  Radiological Exams: No results found.  Assessment/Plan Active Problems:   Acute on chronic respiratory failure with hypoxia (HCC)   Healthcare-associated pneumonia   Tracheostomy status (HCC)   Chronic diastolic heart failure (HCC)   Small bowel obstruction due to adhesions (HCC)   1. Acute on chronic respiratory failure with hypoxia continue with capping as ordered titrate oxygen continue pulmonary toilet 2. Healthcare associated pneumonia treated we will continue with supportive care 3. Tracheostomy doing well with weaning on capping trials 4. Chronic diastolic heart failure compensated 5. Small bowel obstruction status post resection   I have personally seen and evaluated the patient, evaluated laboratory and imaging results, formulated the assessment and plan and placed orders. The Patient requires high complexity decision making with multiple systems involvement.  Rounds were done with the Respiratory Therapy Director and Staff therapists and discussed with nursing staff also.  Yevonne Pax, MD [facility] Pulmonary Critical Care Medicine Sleep Medicine

## 2019-03-23 DIAGNOSIS — J9621 Acute and chronic respiratory failure with hypoxia: Secondary | ICD-10-CM | POA: Diagnosis not present

## 2019-03-23 DIAGNOSIS — K565 Intestinal adhesions [bands], unspecified as to partial versus complete obstruction: Secondary | ICD-10-CM | POA: Diagnosis not present

## 2019-03-23 DIAGNOSIS — J189 Pneumonia, unspecified organism: Secondary | ICD-10-CM | POA: Diagnosis not present

## 2019-03-23 DIAGNOSIS — I5032 Chronic diastolic (congestive) heart failure: Secondary | ICD-10-CM | POA: Diagnosis not present

## 2019-03-23 NOTE — Progress Notes (Signed)
Pulmonary Critical Care Medicine [facility] [place]   PULMONARY CRITICAL CARE SERVICE  PROGRESS NOTE  Date of Service: 03/23/2019  Kristina Holmes  [id number]  DOB: Aug 31, 1955   DOA: 03/04/2019  Referring Physician: Carron Curie, MD  HPI: Kristina Holmes is a 64 y.o. female seen for follow up of Acute on Chronic Respiratory Failure.  She is capping ready to be decannulated completed 72 hours  Medications: Reviewed on Rounds  Physical Exam:  Vitals: Temperature is 98.1 pulse 71 respiratory 25 blood pressure 99/50 saturations 98%  Ventilator Settings capping off the vent  . General: Comfortable at this time . Eyes: Grossly normal lids, irises & conjunctiva . ENT: grossly tongue is normal . Neck: no obvious mass . Cardiovascular: S1 S2 normal no gallop . Respiratory: No rhonchi no rales are noted . Abdomen: soft . Skin: no rash seen on limited exam . Musculoskeletal: not rigid . Psychiatric:unable to assess . Neurologic: no seizure no involuntary movements         Lab Data:   Basic Metabolic Panel: Recent Labs  Lab 03/17/19 0523 03/21/19 0538  NA 137 135  K 4.1 4.5  CL 100 99  CO2 27 27  GLUCOSE 128* 129*  BUN 31* 53*  CREATININE 0.76 0.74  CALCIUM 8.9 9.3    ABG: No results for input(s): PHART, PCO2ART, PO2ART, HCO3, O2SAT in the last 168 hours.  Liver Function Tests: No results for input(s): AST, ALT, ALKPHOS, BILITOT, PROT, ALBUMIN in the last 168 hours. No results for input(s): LIPASE, AMYLASE in the last 168 hours. No results for input(s): AMMONIA in the last 168 hours.  CBC: Recent Labs  Lab 03/17/19 0523 03/21/19 0538  WBC 7.6 7.5  HGB 9.2* 9.1*  HCT 29.7* 29.5*  MCV 89.2 89.1  PLT 214 228    Cardiac Enzymes: No results for input(s): CKTOTAL, CKMB, CKMBINDEX, TROPONINI in the last 168 hours.  BNP (last 3 results) No results for input(s): BNP in the last 8760 hours.  ProBNP (last 3 results) No results for input(s):  PROBNP in the last 8760 hours.  Radiological Exams: No results found.  Assessment/Plan Active Problems:   Acute on chronic respiratory failure with hypoxia (HCC)   Healthcare-associated pneumonia   Tracheostomy status (HCC)   Chronic diastolic heart failure (HCC)   Small bowel obstruction due to adhesions (HCC)   1. Acute on chronic respiratory failure with hypoxia plan is to continue with the wean and proceed to decannulation 2. Healthcare associated pneumonia treated 3. Tracheostomy will be removed 4. Chronic diastolic heart failure compensated 5. Small bowel obstruction no change we will continue to monitor doing better   I have personally seen and evaluated the patient, evaluated laboratory and imaging results, formulated the assessment and plan and placed orders. The Patient requires high complexity decision making with multiple systems involvement.  Rounds were done with the Respiratory Therapy Director and Staff therapists and discussed with nursing staff also.  Yevonne Pax, MD [facility] Pulmonary Critical Care Medicine Sleep Medicine

## 2019-03-24 DIAGNOSIS — K565 Intestinal adhesions [bands], unspecified as to partial versus complete obstruction: Secondary | ICD-10-CM | POA: Diagnosis not present

## 2019-03-24 DIAGNOSIS — I5032 Chronic diastolic (congestive) heart failure: Secondary | ICD-10-CM | POA: Diagnosis not present

## 2019-03-24 DIAGNOSIS — J9621 Acute and chronic respiratory failure with hypoxia: Secondary | ICD-10-CM | POA: Diagnosis not present

## 2019-03-24 DIAGNOSIS — J189 Pneumonia, unspecified organism: Secondary | ICD-10-CM | POA: Diagnosis not present

## 2019-03-24 NOTE — Progress Notes (Signed)
Pulmonary Critical Care Medicine [facility] [place]   PULMONARY CRITICAL CARE SERVICE  PROGRESS NOTE  Date of Service: 03/24/2019  Kristina Holmes  [id number]  DOB: 1956/01/06   DOA: 03/04/2019  Referring Physician: Carron Curie, MD  HPI: Kristina Holmes is a 64 y.o. female seen for follow up of Acute on Chronic Respiratory Failure.  She is doing fine decannulated no issues noted overnight excellent saturations  Medications: Reviewed on Rounds  Physical Exam:  Vitals: Temperature is 97.7 pulse 93 respiratory rate 30 blood pressure is 101/53 saturations 99%  Ventilator Settings decannulated off the ventilator  . General: Comfortable at this time . Eyes: Grossly normal lids, irises & conjunctiva . ENT: grossly tongue is normal . Neck: no obvious mass . Cardiovascular: S1 S2 normal no gallop . Respiratory: No rhonchi no rales . Abdomen: soft . Skin: no rash seen on limited exam . Musculoskeletal: not rigid . Psychiatric:unable to assess . Neurologic: no seizure no involuntary movements         Lab Data:   Basic Metabolic Panel: Recent Labs  Lab 03/21/19 0538  NA 135  K 4.5  CL 99  CO2 27  GLUCOSE 129*  BUN 53*  CREATININE 0.74  CALCIUM 9.3    ABG: No results for input(s): PHART, PCO2ART, PO2ART, HCO3, O2SAT in the last 168 hours.  Liver Function Tests: No results for input(s): AST, ALT, ALKPHOS, BILITOT, PROT, ALBUMIN in the last 168 hours. No results for input(s): LIPASE, AMYLASE in the last 168 hours. No results for input(s): AMMONIA in the last 168 hours.  CBC: Recent Labs  Lab 03/21/19 0538  WBC 7.5  HGB 9.1*  HCT 29.5*  MCV 89.1  PLT 228    Cardiac Enzymes: No results for input(s): CKTOTAL, CKMB, CKMBINDEX, TROPONINI in the last 168 hours.  BNP (last 3 results) No results for input(s): BNP in the last 8760 hours.  ProBNP (last 3 results) No results for input(s): PROBNP in the last 8760 hours.  Radiological Exams: No  results found.  Assessment/Plan Active Problems:   Acute on chronic respiratory failure with hypoxia (HCC)   Healthcare-associated pneumonia   Tracheostomy status (HCC)   Chronic diastolic heart failure (HCC)   Small bowel obstruction due to adhesions (HCC)   1. Acute on chronic respiratory failure with hypoxia resolved patient has been successfully weaned and decannulated 2. Healthcare associated pneumonia treated clinically is improving 3. Tracheostomy removed 4. Chronic diastolic heart failure compensated 5. Small bowel obstruction status post resection   I have personally seen and evaluated the patient, evaluated laboratory and imaging results, formulated the assessment and plan and placed orders. The Patient requires high complexity decision making with multiple systems involvement.  Rounds were done with the Respiratory Therapy Director and Staff therapists and discussed with nursing staff also.  Yevonne Pax, MD [facility] Pulmonary Critical Care Medicine Sleep Medicine

## 2019-03-25 DIAGNOSIS — K565 Intestinal adhesions [bands], unspecified as to partial versus complete obstruction: Secondary | ICD-10-CM | POA: Diagnosis not present

## 2019-03-25 DIAGNOSIS — J189 Pneumonia, unspecified organism: Secondary | ICD-10-CM | POA: Diagnosis not present

## 2019-03-25 DIAGNOSIS — J9621 Acute and chronic respiratory failure with hypoxia: Secondary | ICD-10-CM | POA: Diagnosis not present

## 2019-03-25 DIAGNOSIS — I5032 Chronic diastolic (congestive) heart failure: Secondary | ICD-10-CM | POA: Diagnosis not present

## 2019-03-25 NOTE — Progress Notes (Addendum)
Pulmonary Critical Care Medicine [facility] [place]   PULMONARY CRITICAL CARE SERVICE  PROGRESS NOTE  Date of Service: 03/25/2019  Kristina Holmes  [id number]  DOB: March 08, 1955   DOA: 03/04/2019  Referring Physician: Carron Curie, MD  HPI: Kristina Holmes is a 64 y.o. female seen for follow up of Acute on Chronic Respiratory Failure.  Patient is decannulated at this time satting well no fever distress.  Medications: Reviewed on Rounds  Physical Exam:  Vitals: Pulse 67 R 33 BP 103/56 O2 sat 99% temp 98.0  Ventilator Settings not be on ventilator  . General: Comfortable at this time . Eyes: Grossly normal lids, irises & conjunctiva . ENT: grossly tongue is normal . Neck: no obvious mass . Cardiovascular: S1 S2 normal no gallop . Respiratory: No rales or rhonchi noted . Abdomen: soft . Skin: no rash seen on limited exam . Musculoskeletal: not rigid . Psychiatric:unable to assess . Neurologic: no seizure no involuntary movements         Lab Data:   Basic Metabolic Panel: Recent Labs  Lab 03/21/19 0538  NA 135  K 4.5  CL 99  CO2 27  GLUCOSE 129*  BUN 53*  CREATININE 0.74  CALCIUM 9.3    ABG: No results for input(s): PHART, PCO2ART, PO2ART, HCO3, O2SAT in the last 168 hours.  Liver Function Tests: No results for input(s): AST, ALT, ALKPHOS, BILITOT, PROT, ALBUMIN in the last 168 hours. No results for input(s): LIPASE, AMYLASE in the last 168 hours. No results for input(s): AMMONIA in the last 168 hours.  CBC: Recent Labs  Lab 03/21/19 0538  WBC 7.5  HGB 9.1*  HCT 29.5*  MCV 89.1  PLT 228    Cardiac Enzymes: No results for input(s): CKTOTAL, CKMB, CKMBINDEX, TROPONINI in the last 168 hours.  BNP (last 3 results) No results for input(s): BNP in the last 8760 hours.  ProBNP (last 3 results) No results for input(s): PROBNP in the last 8760 hours.  Radiological Exams: No results found.  Assessment/Plan Active Problems:   Acute on  chronic respiratory failure with hypoxia (HCC)   Healthcare-associated pneumonia   Tracheostomy status (HCC)   Chronic diastolic heart failure (HCC)   Small bowel obstruction due to adhesions (HCC)   1. Acute on chronic respiratory failure with hypoxia resolved patient has been successfully weaned and decannulated 2. Healthcare associated pneumonia treated clinically is improving 3. Tracheostomy removed 4. Chronic diastolic heart failure compensated 5. Small bowel obstruction status post resection   I have personally seen and evaluated the patient, evaluated laboratory and imaging results, formulated the assessment and plan and placed orders. The Patient requires high complexity decision making with multiple systems involvement.  Rounds were done with the Respiratory Therapy Director and Staff therapists and discussed with nursing staff also.  Yevonne Pax, MD [facility] Pulmonary Critical Care Medicine Sleep Medicine

## 2019-03-27 LAB — BASIC METABOLIC PANEL
Anion gap: 14 (ref 5–15)
BUN: 39 mg/dL — ABNORMAL HIGH (ref 8–23)
CO2: 25 mmol/L (ref 22–32)
Calcium: 9.2 mg/dL (ref 8.9–10.3)
Chloride: 99 mmol/L (ref 98–111)
Creatinine, Ser: 0.83 mg/dL (ref 0.44–1.00)
GFR calc Af Amer: >60 mL/min (ref >60)
GFR calc non Af Amer: >60 mL/min (ref >60)
Glucose, Bld: 107 mg/dL — ABNORMAL HIGH (ref 70–99)
Potassium: 4.6 mmol/L (ref 3.5–5.1)
Sodium: 138 mmol/L (ref 135–145)

## 2019-03-27 LAB — CBC
HCT: 28.8 % — ABNORMAL LOW (ref 36.0–46.0)
Hemoglobin: 8.9 g/dL — ABNORMAL LOW (ref 12.0–15.0)
MCH: 27.9 pg (ref 26.0–34.0)
MCHC: 30.9 g/dL (ref 30.0–36.0)
MCV: 90.3 fL (ref 80.0–100.0)
Platelets: 228 10*3/uL (ref 150–400)
RBC: 3.19 MIL/uL — ABNORMAL LOW (ref 3.87–5.11)
RDW: 17.2 % — ABNORMAL HIGH (ref 11.5–15.5)
WBC: 6.5 10*3/uL (ref 4.0–10.5)
nRBC: 0 % (ref 0.0–0.2)

## 2019-03-29 ENCOUNTER — Institutional Professional Consult (permissible substitution) (HOSPITAL_COMMUNITY): Payer: Self-pay

## 2019-03-29 LAB — MAGNESIUM: Magnesium: 1.9 mg/dL (ref 1.7–2.4)

## 2019-04-04 LAB — CBC
HCT: 30.2 % — ABNORMAL LOW (ref 36.0–46.0)
Hemoglobin: 9.5 g/dL — ABNORMAL LOW (ref 12.0–15.0)
MCH: 28.2 pg (ref 26.0–34.0)
MCHC: 31.5 g/dL (ref 30.0–36.0)
MCV: 89.6 fL (ref 80.0–100.0)
Platelets: 236 10*3/uL (ref 150–400)
RBC: 3.37 MIL/uL — ABNORMAL LOW (ref 3.87–5.11)
RDW: 16.9 % — ABNORMAL HIGH (ref 11.5–15.5)
WBC: 5.1 10*3/uL (ref 4.0–10.5)
nRBC: 0 % (ref 0.0–0.2)

## 2019-04-04 LAB — BASIC METABOLIC PANEL
Anion gap: 11 (ref 5–15)
BUN: 38 mg/dL — ABNORMAL HIGH (ref 8–23)
CO2: 26 mmol/L (ref 22–32)
Calcium: 9.2 mg/dL (ref 8.9–10.3)
Chloride: 100 mmol/L (ref 98–111)
Creatinine, Ser: 0.88 mg/dL (ref 0.44–1.00)
GFR calc Af Amer: >60 mL/min (ref >60)
GFR calc non Af Amer: >60 mL/min (ref >60)
Glucose, Bld: 128 mg/dL — ABNORMAL HIGH (ref 70–99)
Potassium: 5 mmol/L (ref 3.5–5.1)
Sodium: 137 mmol/L (ref 135–145)

## 2019-04-04 LAB — MAGNESIUM: Magnesium: 1.9 mg/dL (ref 1.7–2.4)

## 2019-04-06 LAB — BASIC METABOLIC PANEL
Anion gap: 9 (ref 5–15)
BUN: 43 mg/dL — ABNORMAL HIGH (ref 8–23)
CO2: 29 mmol/L (ref 22–32)
Calcium: 9.5 mg/dL (ref 8.9–10.3)
Chloride: 102 mmol/L (ref 98–111)
Creatinine, Ser: 0.9 mg/dL (ref 0.44–1.00)
GFR calc Af Amer: >60 mL/min (ref >60)
GFR calc non Af Amer: >60 mL/min (ref >60)
Glucose, Bld: 109 mg/dL — ABNORMAL HIGH (ref 70–99)
Potassium: 4.5 mmol/L (ref 3.5–5.1)
Sodium: 140 mmol/L (ref 135–145)

## 2019-04-07 LAB — SARS CORONAVIRUS 2 (TAT 6-24 HRS): SARS Coronavirus 2: NEGATIVE

## 2019-04-08 ENCOUNTER — Other Ambulatory Visit (HOSPITAL_COMMUNITY): Payer: Self-pay

## 2020-09-06 IMAGING — DX DG CHEST 1V PORT
1 series · 1 of 1 positions shown · non-contrast
Comparison: February 09, 2019.

CLINICAL DATA: Respiratory failure.

EXAM:
PORTABLE CHEST 1 VIEW

[chest]
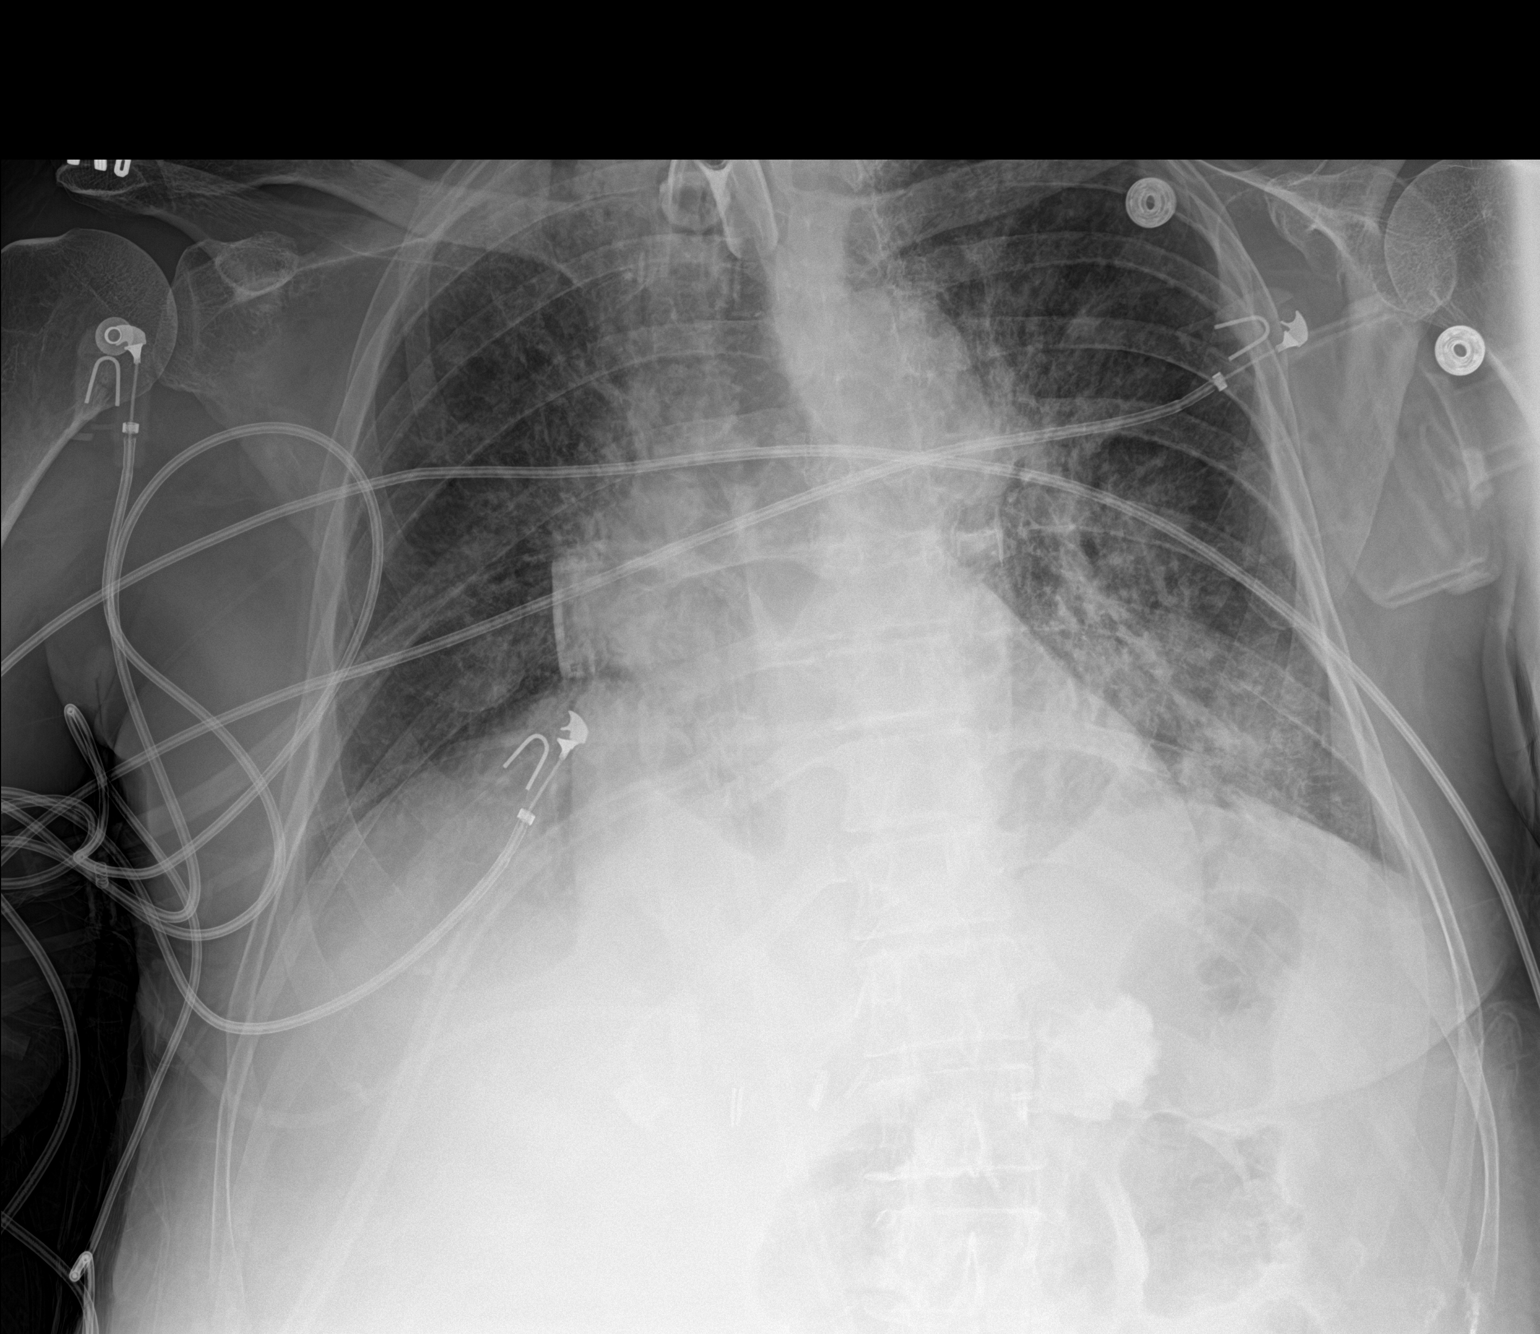

[1 of 1 positions shown; findings below may reference images not displayed]

FINDINGS: Stable cardiomediastinal silhouette. Tracheostomy tube is in good
position. No pneumothorax or pleural effusion is noted. Right apical
and left basilar opacities are noted concerning for multifocal
pneumonia. Bony thorax is unremarkable.
IMPRESSION: Right apical and left basilar opacities are noted concerning for
multifocal pneumonia.
